# Patient Record
Sex: Female | Born: 1945 | Race: Black or African American | Hispanic: No | Smoking: Current every day smoker
Health system: Southern US, Community
[De-identification: ages and names within clinical notes are randomized; demographics above are authoritative.]

---

## 2006-07-13 ENCOUNTER — Emergency Department (HOSPITAL_COMMUNITY): Admission: EM | Admit: 2006-07-13 | Discharge: 2006-07-13 | Payer: Self-pay | Admitting: Emergency Medicine

## 2012-05-21 DIAGNOSIS — J309 Allergic rhinitis, unspecified: Secondary | ICD-10-CM | POA: Insufficient documentation

## 2012-05-21 DIAGNOSIS — F17210 Nicotine dependence, cigarettes, uncomplicated: Secondary | ICD-10-CM | POA: Insufficient documentation

## 2012-05-21 DIAGNOSIS — I10 Essential (primary) hypertension: Secondary | ICD-10-CM | POA: Insufficient documentation

## 2012-07-02 DIAGNOSIS — L309 Dermatitis, unspecified: Secondary | ICD-10-CM | POA: Insufficient documentation

## 2012-10-14 ENCOUNTER — Other Ambulatory Visit: Payer: Self-pay | Admitting: Family Medicine

## 2012-11-24 ENCOUNTER — Ambulatory Visit
Admission: RE | Admit: 2012-11-24 | Discharge: 2012-11-24 | Disposition: A | Discharge status: Home / Self Care | Payer: Medicare Other | Source: Ambulatory Visit | Attending: Family Medicine | Admitting: Family Medicine

## 2012-11-24 DIAGNOSIS — Z1231 Encounter for screening mammogram for malignant neoplasm of breast: Secondary | ICD-10-CM

## 2012-11-24 DIAGNOSIS — Z780 Asymptomatic menopausal state: Secondary | ICD-10-CM

## 2013-01-24 DIAGNOSIS — D126 Benign neoplasm of colon, unspecified: Secondary | ICD-10-CM | POA: Insufficient documentation

## 2013-11-09 ENCOUNTER — Other Ambulatory Visit: Payer: Self-pay

## 2013-11-09 DIAGNOSIS — M159 Polyosteoarthritis, unspecified: Secondary | ICD-10-CM | POA: Insufficient documentation

## 2013-11-09 DIAGNOSIS — Z1231 Encounter for screening mammogram for malignant neoplasm of breast: Secondary | ICD-10-CM

## 2013-12-08 ENCOUNTER — Ambulatory Visit
Admission: RE | Admit: 2013-12-08 | Discharge: 2013-12-08 | Disposition: A | Discharge status: Home / Self Care | Payer: Medicare Other | Source: Ambulatory Visit

## 2013-12-08 DIAGNOSIS — Z1231 Encounter for screening mammogram for malignant neoplasm of breast: Secondary | ICD-10-CM

## 2014-02-08 DIAGNOSIS — H35039 Hypertensive retinopathy, unspecified eye: Secondary | ICD-10-CM | POA: Insufficient documentation

## 2014-11-01 ENCOUNTER — Other Ambulatory Visit: Payer: Self-pay

## 2014-11-01 DIAGNOSIS — Z1231 Encounter for screening mammogram for malignant neoplasm of breast: Secondary | ICD-10-CM

## 2014-12-12 ENCOUNTER — Ambulatory Visit
Admission: RE | Admit: 2014-12-12 | Discharge: 2014-12-12 | Disposition: A | Discharge status: Home / Self Care | Payer: Medicare Other | Source: Ambulatory Visit

## 2014-12-12 DIAGNOSIS — Z1231 Encounter for screening mammogram for malignant neoplasm of breast: Secondary | ICD-10-CM

## 2015-12-05 ENCOUNTER — Other Ambulatory Visit: Payer: Self-pay

## 2015-12-05 DIAGNOSIS — Z1231 Encounter for screening mammogram for malignant neoplasm of breast: Secondary | ICD-10-CM

## 2015-12-27 DIAGNOSIS — G250 Essential tremor: Secondary | ICD-10-CM | POA: Insufficient documentation

## 2016-01-02 ENCOUNTER — Other Ambulatory Visit: Payer: Self-pay | Admitting: Family Medicine

## 2016-01-02 DIAGNOSIS — E2839 Other primary ovarian failure: Secondary | ICD-10-CM

## 2016-01-18 ENCOUNTER — Ambulatory Visit: Payer: Medicare Other

## 2016-01-23 ENCOUNTER — Ambulatory Visit
Admission: RE | Admit: 2016-01-23 | Discharge: 2016-01-23 | Disposition: A | Discharge status: Home / Self Care | Payer: Medicare HMO | Source: Ambulatory Visit

## 2016-01-23 ENCOUNTER — Ambulatory Visit
Admission: RE | Admit: 2016-01-23 | Discharge: 2016-01-23 | Disposition: A | Discharge status: Home / Self Care | Payer: Medicare HMO | Source: Ambulatory Visit | Attending: Family Medicine | Admitting: Family Medicine

## 2016-01-23 DIAGNOSIS — Z1231 Encounter for screening mammogram for malignant neoplasm of breast: Secondary | ICD-10-CM

## 2016-01-23 DIAGNOSIS — E2839 Other primary ovarian failure: Secondary | ICD-10-CM

## 2016-02-06 DIAGNOSIS — M8580 Other specified disorders of bone density and structure, unspecified site: Secondary | ICD-10-CM | POA: Insufficient documentation

## 2016-02-06 DIAGNOSIS — M810 Age-related osteoporosis without current pathological fracture: Secondary | ICD-10-CM | POA: Insufficient documentation

## 2016-11-06 ENCOUNTER — Other Ambulatory Visit: Payer: Self-pay | Admitting: Acute Care

## 2016-11-06 DIAGNOSIS — F17210 Nicotine dependence, cigarettes, uncomplicated: Principal | ICD-10-CM

## 2016-12-04 ENCOUNTER — Encounter: Payer: Self-pay | Admitting: Acute Care

## 2016-12-04 ENCOUNTER — Ambulatory Visit (INDEPENDENT_AMBULATORY_CARE_PROVIDER_SITE_OTHER)
Admission: RE | Admit: 2016-12-04 | Discharge: 2016-12-04 | Disposition: A | Discharge status: Home / Self Care | Payer: Medicare HMO | Source: Ambulatory Visit | Attending: Acute Care | Admitting: Acute Care

## 2016-12-04 ENCOUNTER — Ambulatory Visit (INDEPENDENT_AMBULATORY_CARE_PROVIDER_SITE_OTHER): Payer: Medicare HMO | Admitting: Acute Care

## 2016-12-04 DIAGNOSIS — F17210 Nicotine dependence, cigarettes, uncomplicated: Secondary | ICD-10-CM

## 2016-12-04 DIAGNOSIS — Z87891 Personal history of nicotine dependence: Secondary | ICD-10-CM | POA: Diagnosis not present

## 2016-12-04 NOTE — Progress Notes (Signed)
Shared Decision Making Visit Lung Cancer Screening Program [phone])   Eligibility:  Age 71 y.o.  Pack Years Smoking History Calculation 52 pack year smoking history (# packs/per year x # years smoked)  Recent History of coughing up blood  no  Unexplained weight loss? no ( >Than 15 pounds within the last 6 months )  Prior History Lung / other cancer no (Diagnosis within the last 5 years already requiring surveillance chest CT Scans).  Smoking Status Current Smoker  Former Smokers: Years since quit: Not applicable  Quit Date: Not applicable  Visit Components:  Discussion included one or more decision making aids. yes  Discussion included risk/benefits of screening. yes  Discussion included potential follow up diagnostic testing for abnormal scans. yes  Discussion included meaning and risk of over diagnosis. yes  Discussion included meaning and risk of False Positives. yes  Discussion included meaning of total radiation exposure. yes  Counseling Included:  Importance of adherence to annual lung cancer LDCT screening. yes  Impact of comorbidities on ability to participate in the program. yes  Ability and willingness to under diagnostic treatment. yes  Smoking Cessation Counseling:  Current Smokers:   Discussed importance of smoking cessation. yes  Information about tobacco cessation classes and interventions provided to patient. yes  Patient provided with "ticket" for LDCT Scan. yes  Symptomatic Patient. no  Counseling  Diagnosis Code: Tobacco Use Z72.0  Asymptomatic Patient yes  Counseling (Intermediate counseling: > three minutes counseling) [id number]  Former Smokers:   Discussed the importance of maintaining cigarette abstinence. yes  Diagnosis Code: Personal History of Nicotine Dependence. W29.562  Information about tobacco cessation classes and interventions provided to patient. Yes  Patient provided with "ticket" for LDCT Scan. yes  Written Order  for Lung Cancer Screening with LDCT placed in Epic. Yes (CT Chest Lung Cancer Screening Low Dose W/O CM) [id number] Z12.2-Screening of respiratory organs Z87.891-Personal history of nicotine dependence  I have spent 25 minutes of face to face time with Abigail Guerrero discussing the risks and benefits of lung cancer screening. We viewed a power point together that explained in detail the above noted topics. We paused at intervals to allow for questions to be asked and answered to ensure understanding.We discussed that the single most powerful action that she can take to decrease her risk of developing lung cancer is to quit smoking. We discussed whether or not she is ready to commit to setting a quit date. She is currently not ready to set a quit date. We discussed options for tools to aid in quitting smoking including nicotine replacement therapy, non-nicotine medications, support groups, Quit Smart classes, and behavior modification. We discussed that often times setting smaller, more achievable goals, such as eliminating 1 cigarette a day for a week and then 2 cigarettes a day for a week can be helpful in slowly decreasing the number of cigarettes smoked. This allows for a sense of accomplishment as well as providing a clinical benefit. I gave her the " Be Stronger Than Your Excuses" card with contact information for community resources, classes, free nicotine replacement therapy, and access to mobile apps, text messaging, and on-line smoking cessation help. I have also given her my card and contact information in the event she needs to contact me. We discussed the time and location of the scan, and that either Abigail Miyamoto RN, or I will call with the results within 24-48 hours of receiving them. I have offered her  a copy of the power point we  viewed  as a resource in the event they need reinforcement of the concepts we discussed today in the office. The patient verbalized understanding of all of  the above  and had no further questions upon leaving the office. They have my contact information in the event they have any further questions.  I spent 3-4 minutes of today's visit counseling on the importance of smoking cessation. We reviewed the health risk factors for continued tobacco abuse.  We discussed that there has been a high incidence of coronary artery disease noted on these scans. I explained to the patient that degree of severity cannot be determined as this is a non-gated exam. I am unsure if this patient is taking statin therapy as there is no medical record with her medication list in Epic. I explained to her that I would send the results of the scan to her primary care provider. I have asked her to follow-up with her PCP as she feels is clinically indicated. Patient verbalized understanding of the above and had no further questions at completion of the appointment.  Bevelyn Ngo, NP 12/04/2016

## 2016-12-16 ENCOUNTER — Other Ambulatory Visit: Payer: Self-pay | Admitting: Acute Care

## 2016-12-16 DIAGNOSIS — F17210 Nicotine dependence, cigarettes, uncomplicated: Principal | ICD-10-CM

## 2017-01-09 DIAGNOSIS — M1A9XX0 Chronic gout, unspecified, without tophus (tophi): Secondary | ICD-10-CM | POA: Insufficient documentation

## 2017-01-22 ENCOUNTER — Other Ambulatory Visit: Payer: Self-pay | Admitting: Family Medicine

## 2017-01-22 DIAGNOSIS — Z1231 Encounter for screening mammogram for malignant neoplasm of breast: Secondary | ICD-10-CM

## 2017-02-12 ENCOUNTER — Ambulatory Visit
Admission: RE | Admit: 2017-02-12 | Discharge: 2017-02-12 | Disposition: A | Discharge status: Home / Self Care | Payer: Medicare HMO | Source: Ambulatory Visit | Attending: Family Medicine | Admitting: Family Medicine

## 2017-02-12 DIAGNOSIS — Z1231 Encounter for screening mammogram for malignant neoplasm of breast: Secondary | ICD-10-CM

## 2017-12-16 ENCOUNTER — Ambulatory Visit (INDEPENDENT_AMBULATORY_CARE_PROVIDER_SITE_OTHER)
Admission: RE | Admit: 2017-12-16 | Discharge: 2017-12-16 | Disposition: A | Discharge status: Home / Self Care | Payer: Medicare HMO | Source: Ambulatory Visit | Attending: Acute Care | Admitting: Acute Care

## 2017-12-16 DIAGNOSIS — F17210 Nicotine dependence, cigarettes, uncomplicated: Secondary | ICD-10-CM | POA: Diagnosis not present

## 2017-12-26 ENCOUNTER — Other Ambulatory Visit: Payer: Self-pay | Admitting: Acute Care

## 2017-12-26 DIAGNOSIS — F17210 Nicotine dependence, cigarettes, uncomplicated: Principal | ICD-10-CM

## 2017-12-26 DIAGNOSIS — Z122 Encounter for screening for malignant neoplasm of respiratory organs: Secondary | ICD-10-CM

## 2018-01-28 ENCOUNTER — Other Ambulatory Visit: Payer: Self-pay | Admitting: Family Medicine

## 2018-01-28 DIAGNOSIS — Z1231 Encounter for screening mammogram for malignant neoplasm of breast: Secondary | ICD-10-CM

## 2018-02-18 ENCOUNTER — Ambulatory Visit
Admission: RE | Admit: 2018-02-18 | Discharge: 2018-02-18 | Disposition: A | Discharge status: Home / Self Care | Payer: Medicare HMO | Source: Ambulatory Visit | Attending: Family Medicine | Admitting: Family Medicine

## 2018-02-18 DIAGNOSIS — Z1231 Encounter for screening mammogram for malignant neoplasm of breast: Secondary | ICD-10-CM

## 2018-05-13 ENCOUNTER — Other Ambulatory Visit: Payer: Self-pay | Admitting: Family Medicine

## 2018-05-13 DIAGNOSIS — E2839 Other primary ovarian failure: Secondary | ICD-10-CM

## 2018-07-07 ENCOUNTER — Ambulatory Visit
Admission: RE | Admit: 2018-07-07 | Discharge: 2018-07-07 | Disposition: A | Discharge status: Home / Self Care | Payer: Medicare HMO | Source: Ambulatory Visit | Attending: Family Medicine | Admitting: Family Medicine

## 2018-07-07 DIAGNOSIS — E2839 Other primary ovarian failure: Secondary | ICD-10-CM

## 2019-02-09 ENCOUNTER — Other Ambulatory Visit: Payer: Self-pay | Admitting: *Deleted

## 2019-02-09 DIAGNOSIS — Z122 Encounter for screening for malignant neoplasm of respiratory organs: Secondary | ICD-10-CM

## 2019-02-09 DIAGNOSIS — Z87891 Personal history of nicotine dependence: Secondary | ICD-10-CM

## 2019-02-09 DIAGNOSIS — F17210 Nicotine dependence, cigarettes, uncomplicated: Secondary | ICD-10-CM

## 2019-03-12 ENCOUNTER — Ambulatory Visit (INDEPENDENT_AMBULATORY_CARE_PROVIDER_SITE_OTHER)
Admission: RE | Admit: 2019-03-12 | Discharge: 2019-03-12 | Disposition: A | Discharge status: Home / Self Care | Payer: Medicare HMO | Source: Ambulatory Visit | Attending: Acute Care | Admitting: Acute Care

## 2019-03-12 ENCOUNTER — Other Ambulatory Visit: Payer: Self-pay

## 2019-03-12 DIAGNOSIS — Z122 Encounter for screening for malignant neoplasm of respiratory organs: Secondary | ICD-10-CM

## 2019-03-12 DIAGNOSIS — Z87891 Personal history of nicotine dependence: Secondary | ICD-10-CM

## 2019-03-12 DIAGNOSIS — F17210 Nicotine dependence, cigarettes, uncomplicated: Secondary | ICD-10-CM

## 2019-03-22 ENCOUNTER — Other Ambulatory Visit: Payer: Self-pay | Admitting: *Deleted

## 2019-03-22 DIAGNOSIS — Z87891 Personal history of nicotine dependence: Secondary | ICD-10-CM

## 2019-03-22 DIAGNOSIS — F17210 Nicotine dependence, cigarettes, uncomplicated: Secondary | ICD-10-CM

## 2019-03-22 DIAGNOSIS — Z122 Encounter for screening for malignant neoplasm of respiratory organs: Secondary | ICD-10-CM

## 2019-03-25 ENCOUNTER — Other Ambulatory Visit: Payer: Self-pay | Admitting: Family Medicine

## 2019-03-25 DIAGNOSIS — Z1231 Encounter for screening mammogram for malignant neoplasm of breast: Secondary | ICD-10-CM

## 2019-05-12 ENCOUNTER — Ambulatory Visit
Admission: RE | Admit: 2019-05-12 | Discharge: 2019-05-12 | Disposition: A | Discharge status: Home / Self Care | Payer: Medicare HMO | Source: Ambulatory Visit | Attending: Family Medicine | Admitting: Family Medicine

## 2019-05-12 ENCOUNTER — Other Ambulatory Visit: Payer: Self-pay

## 2019-05-12 DIAGNOSIS — Z1231 Encounter for screening mammogram for malignant neoplasm of breast: Secondary | ICD-10-CM

## 2020-04-03 ENCOUNTER — Other Ambulatory Visit: Payer: Self-pay | Admitting: Family Medicine

## 2020-04-03 ENCOUNTER — Ambulatory Visit (INDEPENDENT_AMBULATORY_CARE_PROVIDER_SITE_OTHER)
Admission: RE | Admit: 2020-04-03 | Discharge: 2020-04-03 | Disposition: A | Discharge status: Home / Self Care | Payer: Medicare HMO | Source: Ambulatory Visit | Attending: Acute Care | Admitting: Acute Care

## 2020-04-03 ENCOUNTER — Other Ambulatory Visit: Payer: Self-pay

## 2020-04-03 DIAGNOSIS — Z122 Encounter for screening for malignant neoplasm of respiratory organs: Secondary | ICD-10-CM

## 2020-04-03 DIAGNOSIS — F17210 Nicotine dependence, cigarettes, uncomplicated: Secondary | ICD-10-CM

## 2020-04-03 DIAGNOSIS — Z87891 Personal history of nicotine dependence: Secondary | ICD-10-CM

## 2020-04-03 DIAGNOSIS — Z1231 Encounter for screening mammogram for malignant neoplasm of breast: Secondary | ICD-10-CM

## 2020-04-06 ENCOUNTER — Other Ambulatory Visit: Payer: Self-pay | Admitting: *Deleted

## 2020-04-06 DIAGNOSIS — F17210 Nicotine dependence, cigarettes, uncomplicated: Secondary | ICD-10-CM

## 2020-04-06 DIAGNOSIS — Z87891 Personal history of nicotine dependence: Secondary | ICD-10-CM

## 2020-04-06 NOTE — Progress Notes (Signed)
Please call patient and let them  know their  low dose Ct was read as a Lung RADS 2: nodules that are benign in appearance and behavior with a very low likelihood of becoming a clinically active cancer due to size or lack of growth. Recommendation per radiology is for a repeat LDCT in 12 months. .Please let them  know we will order and schedule their  annual screening scan for 03/2021 Please let them  know there was notation of CAD on their  scan.  Please remind the patient  that this is a non-gated exam therefore degree or severity of disease  cannot be determined. Please have them  follow up with their PCP regarding potential risk factor modification, dietary therapy or pharmacologic therapy if clinically indicated. Pt.  is not  currently on statin therapy. Please place order for annual  screening scan for  03/2021 and fax results to PCP. Thanks so much.  

## 2020-05-12 ENCOUNTER — Ambulatory Visit
Admission: RE | Admit: 2020-05-12 | Discharge: 2020-05-12 | Disposition: A | Discharge status: Home / Self Care | Payer: Medicare HMO | Source: Ambulatory Visit | Attending: Family Medicine | Admitting: Family Medicine

## 2020-05-12 ENCOUNTER — Other Ambulatory Visit: Payer: Self-pay

## 2020-05-12 DIAGNOSIS — Z1231 Encounter for screening mammogram for malignant neoplasm of breast: Secondary | ICD-10-CM

## 2020-06-07 DIAGNOSIS — R87610 Atypical squamous cells of undetermined significance on cytologic smear of cervix (ASC-US): Secondary | ICD-10-CM | POA: Insufficient documentation

## 2020-06-07 DIAGNOSIS — E785 Hyperlipidemia, unspecified: Secondary | ICD-10-CM | POA: Insufficient documentation

## 2020-06-07 DIAGNOSIS — I10 Essential (primary) hypertension: Secondary | ICD-10-CM | POA: Insufficient documentation

## 2020-10-24 ENCOUNTER — Other Ambulatory Visit: Payer: Self-pay | Admitting: Family Medicine

## 2020-10-24 DIAGNOSIS — M8580 Other specified disorders of bone density and structure, unspecified site: Secondary | ICD-10-CM

## 2021-04-05 ENCOUNTER — Other Ambulatory Visit: Payer: Self-pay

## 2021-04-05 ENCOUNTER — Ambulatory Visit (INDEPENDENT_AMBULATORY_CARE_PROVIDER_SITE_OTHER)
Admission: RE | Admit: 2021-04-05 | Discharge: 2021-04-05 | Disposition: A | Discharge status: Home / Self Care | Payer: Medicare HMO | Source: Ambulatory Visit | Attending: Family Medicine | Admitting: Family Medicine

## 2021-04-05 DIAGNOSIS — F17210 Nicotine dependence, cigarettes, uncomplicated: Secondary | ICD-10-CM

## 2021-04-05 DIAGNOSIS — Z87891 Personal history of nicotine dependence: Secondary | ICD-10-CM

## 2021-04-13 ENCOUNTER — Other Ambulatory Visit: Payer: Medicare HMO

## 2021-04-19 NOTE — Progress Notes (Signed)
Please call patient and let them  know their  low dose Ct was read as a Lung RADS 2: nodules that are benign in appearance and behavior with a very low likelihood of becoming a clinically active cancer due to size or lack of growth. Recommendation per radiology is for a repeat LDCT in 12 months. .Please let them  know we will order and schedule their  annual screening scan for 03/2022. Please let them  know there was notation of CAD on their  scan.  Please remind the patient  that this is a non-gated exam therefore degree or severity of disease  cannot be determined. Please have them  follow up with their PCP regarding potential risk factor modification, dietary therapy or pharmacologic therapy if clinically indicated. Pt.  is  currently on statin therapy. Please place order for annual  screening scan for   03/2022 and fax results to PCP. Thanks so much.  + Aortic atherosclerosis, on statin, seen by cards in 2021, make sure she knows to follow up with them. Thanks

## 2021-05-03 ENCOUNTER — Encounter: Payer: Self-pay | Admitting: *Deleted

## 2021-05-03 DIAGNOSIS — Z87891 Personal history of nicotine dependence: Secondary | ICD-10-CM

## 2021-05-03 DIAGNOSIS — F17210 Nicotine dependence, cigarettes, uncomplicated: Secondary | ICD-10-CM

## 2021-06-11 ENCOUNTER — Other Ambulatory Visit: Payer: Self-pay | Admitting: Family Medicine

## 2021-06-11 DIAGNOSIS — Z1231 Encounter for screening mammogram for malignant neoplasm of breast: Secondary | ICD-10-CM

## 2021-06-13 ENCOUNTER — Ambulatory Visit
Admission: RE | Admit: 2021-06-13 | Discharge: 2021-06-13 | Disposition: A | Discharge status: Home / Self Care | Payer: Medicare HMO | Source: Ambulatory Visit | Attending: Family Medicine | Admitting: Family Medicine

## 2021-06-13 DIAGNOSIS — M8580 Other specified disorders of bone density and structure, unspecified site: Secondary | ICD-10-CM

## 2021-06-20 ENCOUNTER — Other Ambulatory Visit: Payer: Self-pay | Admitting: Family Medicine

## 2021-06-20 DIAGNOSIS — M8580 Other specified disorders of bone density and structure, unspecified site: Secondary | ICD-10-CM

## 2021-07-16 ENCOUNTER — Ambulatory Visit
Admission: RE | Admit: 2021-07-16 | Discharge: 2021-07-16 | Disposition: A | Discharge status: Home / Self Care | Payer: Medicare HMO | Source: Ambulatory Visit | Attending: Family Medicine | Admitting: Family Medicine

## 2021-07-16 ENCOUNTER — Ambulatory Visit: Payer: Medicare HMO

## 2021-07-16 ENCOUNTER — Other Ambulatory Visit: Payer: Self-pay

## 2021-07-16 DIAGNOSIS — Z1231 Encounter for screening mammogram for malignant neoplasm of breast: Secondary | ICD-10-CM

## 2021-12-26 ENCOUNTER — Ambulatory Visit: Payer: Medicare HMO

## 2022-01-30 ENCOUNTER — Ambulatory Visit: Payer: Medicare HMO

## 2022-02-26 NOTE — Therapy (Unsigned)
OUTPATIENT PHYSICAL THERAPY LOWER EXTREMITY EVALUATION   Patient Name: Abigail Guerrero MRN: [id number] DOB:11/09/1945, 76 y.o., female Today's Date: 02/27/2022   PT End of Session - 02/27/22 1157     Visit Number 1    Number of Visits 4    Date for PT Re-Evaluation 04/10/22    Authorization Type [employer] MCR    PT Start Time 1115    PT Stop Time 1200    PT Time Calculation (min) 45 min    Activity Tolerance Patient tolerated treatment well    Behavior During Therapy [facility] for tasks assessed/performed             History reviewed. No pertinent past medical history. History reviewed. No pertinent surgical history. There are no problems to display for this patient.   PCP: Tracey Harries, MD  REFERRING PROVIDER: Tracey Harries, MD  REFERRING DIAG: B knee pain  THERAPY DIAG: B knee pain   Rationale for Evaluation and Treatment Rehabilitation  ONSET DATE: 12/10/2021   SUBJECTIVE:   SUBJECTIVE STATEMENT: Describes pain and swelling in B knees L>R, has difficulty arising from sitting, has had knee injections with temporary relief of symptoms.  Has been recommended to undergo B knee replacements but is reluctant.   PERTINENT HISTORY: Patient ID: Abigail Guerrero is a 76 y.o. (DOB 05/31/1946) female.  Assessment and Plan  Abigail Guerrero was seen today for medicare wellness visit.  Diagnoses and all orders for this visit:  At moderate risk for fall - AMB REFERRAL TO PHYSICAL THERAPY EVALUATION AND TREATMENT  Chronic pain of both knees - AMB REFERRAL TO PHYSICAL THERAPY EVALUATION AND TREATMENT   PAIN:  Are you having pain? Yes: NPRS scale: 7/10 Pain location: B knees Pain description: ache Aggravating factors: stairs  Relieving factors: rest  PRECAUTIONS: Fall  WEIGHT BEARING RESTRICTIONS No  FALLS:  Has patient fallen in last 6 months? No  LIVING ENVIRONMENT: Lives with: lives with their family Lives in: House/apartment Stairs:   avoids Has following equipment at home: Single point cane  OCCUPATION: retired  PLOF: Independent with basic ADLs  PATIENT GOALS To reduce my knee pain   OBJECTIVE:   DIAGNOSTIC FINDINGS: none noted  PATIENT SURVEYS:  FOTO 49(61 predicted)  COGNITION:  Overall cognitive status: Within functional limits for tasks assessed     SENSATION: Not tested   MUSCLE LENGTH: Hamstrings: Right 70 deg; Left 70 deg   POSTURE:  B knee valgus    LOWER EXTREMITY ROM:  Active ROM Right eval Left eval  Hip flexion    Hip extension    Hip abduction    Hip adduction    Hip internal rotation    Hip external rotation    Knee flexion 110d 100d  Knee extension 0d 0d  Ankle dorsiflexion    Ankle plantarflexion    Ankle inversion    Ankle eversion     (Blank rows = not tested)  LOWER EXTREMITY MMT:  MMT Right eval Left eval  Hip flexion 4 4  Hip extension    Hip abduction 4- 4-  Hip adduction    Hip internal rotation    Hip external rotation    Knee flexion 4- 4-  Knee extension 4- 4-  Ankle dorsiflexion 4 4  Ankle plantarflexion 4 4  Ankle inversion    Ankle eversion     (Blank rows = not tested)  LOWER EXTREMITY SPECIAL TESTS:  Not applicable   FUNCTIONAL TESTS:  30 seconds chair stand test 3 stand  with UE Assist  GAIT: Distance walked: 18ft x2 Assistive device utilized: None Level of assistance: Modified independence Comments: antalgic gait excess lateral weight shift    TODAY'S TREATMENT: Eval and HEP   PATIENT EDUCATION:  Education details: Discussed eval findings, rehab rationale and POC and patient is in agreement  Person educated: Patient Education method: [employer] Education comprehension: verbalized understanding and needs further education   HOME EXERCISE PROGRAM: Access Code: [email or web address] URL: [email or web address] Date: 02/27/2022 Prepared by: Gustavus Bryant  Exercises - Small Range Straight Leg Raise  - 1  x daily - 7 x weekly - 2 sets - 10 reps - Supine Bridge  - 1 x daily - 7 x weekly - 2 sets - 10 reps - Hooklying Single Leg Bent Knee Fallouts with Resistance  - 1 x daily - 7 x weekly - 2 sets - 10 reps - Heel Raises with Counter Support  - 1 x daily - 7 x weekly - 2 sets - 10 reps - Heel Toe Raises with Counter Support  - 1 x daily - 7 x weekly - 2 sets - 10 reps - Standing Knee Flexion with Counter Support  - 1 x daily - 7 x weekly - 2 sets - 10 reps  ASSESSMENT:  CLINICAL IMPRESSION: Patient is a 76 y.o. female who was seen today for physical therapy evaluation and treatment for B knee pain in presence of B OA.   She has been recommended for B knee replacement but is pursuing conservative options.  Previous knee injections have provided temporary relief.   Patient demos strength deficits in quads primarily and knee flexion is limited by pain and soft tissue restrictions.   OBJECTIVE IMPAIRMENTS Abnormal gait, decreased activity tolerance, decreased balance, decreased coordination, decreased endurance, decreased mobility, difficulty walking, decreased strength, postural dysfunction, and pain.   ACTIVITY LIMITATIONS squatting and stairs    PERSONAL FACTORS Age, Fitness, Past/current experiences, and Time since onset of injury/illness/exacerbation are also affecting patient's functional outcome.   REHAB POTENTIAL: Good  CLINICAL DECISION MAKING: Stable/uncomplicated  EVALUATION COMPLEXITY: Low   GOALS: Goals reviewed with patient? Yes  SHORT TERM GOALS: STGs=LTGs   LONG TERM GOALS: Target date: 03/27/2022   Decrease knee pain to 5/10 Baseline: 7/10 Goal status: INITIAL  2.  4/5 B knee extension strength Baseline: 4-/5 B knee extension strength Goal status: INITIAL  3.  Improve 30s chair stand to 4 reps w/UE assist Baseline: 3 stands with UE assist Goal status: INITIAL  4.  Increase FOTO score to 61 Baseline: 49 Goal status: INITIAL   PLAN: PT FREQUENCY:  1x/week  PT DURATION: 4 weeks  PLANNED INTERVENTIONS: Therapeutic exercises, Therapeutic activity, Neuromuscular re-education, Balance training, Gait training, Patient/Family education, Joint mobilization, Stair training, Aquatic Therapy, Manual therapy, and Re-evaluation  PLAN FOR NEXT SESSION: HEP f/u and update, knee strength as tolerated, balance training, aerobic    Hildred Laser, PT 02/27/2022, 11:58 AM   Referring diagnosis? B knee pain Treatment diagnosis? (if different than referring diagnosis) B knee pain due to advanced OA What was this (referring dx) caused by? []  Surgery []  Fall []  Ongoing issue [x]  Arthritis []  Other: ____________  Laterality: []  Rt []  Lt [x]  Both  Check all possible CPT codes:  *CHOOSE 10 OR LESS*    [x]  97110 (Therapeutic Exercise)  []  (SLP Treatment)  [x]  97112 (Neuro Re-ed)   []  92526 (Swallowing Treatment)   [x]  97116 (Gait Training)   []  (Cognitive Training, 1st 15 minutes) [  x] 97140 (Manual Therapy)   []  97130 (Cognitive Training, each add'l 15 minutes)  [x]  (Re-evaluation)                              []  Other, List CPT Code ____________  [x]  97530 (Therapeutic Activities)     [x]  97535 (Self Care)   []  All codes above (97110 - 97535)  []  97012 (Mechanical Traction)  []  97014 (E-stim Unattended)  []  97032 (E-stim manual)  []  97033 (Ionto)  []  97035 (Ultrasound) []  97750 (Physical Performance Training) [x]  (Aquatic Therapy) []  97016 (Vasopneumatic Device) []  67619 (Paraffin) []  97034 (Contrast Bath) []  97597 (Wound Care 1st 20 sq cm) []  97598 (Wound Care each add'l 20 sq cm) []  97760 (Orthotic Fabrication, Fitting, Training Initial) []  (Prosthetic Management and Training Initial) []  (Orthotic or Prosthetic Training/ Modification Subsequent)

## 2022-02-27 ENCOUNTER — Ambulatory Visit: Payer: Medicare HMO | Attending: Family Medicine

## 2022-02-27 DIAGNOSIS — M25562 Pain in left knee: Secondary | ICD-10-CM | POA: Insufficient documentation

## 2022-02-27 DIAGNOSIS — R2681 Unsteadiness on feet: Secondary | ICD-10-CM | POA: Insufficient documentation

## 2022-02-27 DIAGNOSIS — M25561 Pain in right knee: Secondary | ICD-10-CM | POA: Insufficient documentation

## 2022-02-27 DIAGNOSIS — G8929 Other chronic pain: Secondary | ICD-10-CM | POA: Diagnosis present

## 2022-02-27 DIAGNOSIS — M6281 Muscle weakness (generalized): Secondary | ICD-10-CM | POA: Insufficient documentation

## 2022-03-05 NOTE — Therapy (Signed)
OUTPATIENT PHYSICAL THERAPY TREATMENT NOTE   Patient Name: Abigail Guerrero MRN: [id number] DOB:09/27/1945, 76 y.o., female Today's Date: 03/08/2022  PCP: Tracey Harries, MD REFERRING PROVIDER: Tracey Harries, MD  END OF SESSION:   PT End of Session - 03/08/22 1000     Visit Number 2    Date for PT Re-Evaluation 04/10/22    Authorization Type [employer] MCR    PT Start Time 1000    PT Stop Time 1040    PT Time Calculation (min) 40 min    Activity Tolerance Patient tolerated treatment well    Behavior During Therapy [facility] for tasks assessed/performed             No past medical history on file. No past surgical history on file. There are no problems to display for this patient.   REFERRING DIAG: B knee pain  THERAPY DIAG: B knee pain   Rationale for Evaluation and Treatment Rehabilitation  PERTINENT HISTORY: Patient ID: Abigail Guerrero is a 76 y.o. (DOB [birth year]) female.  Assessment and Plan  Alphonsine was seen today for medicare wellness visit.  Diagnoses and all orders for this visit:  At moderate risk for fall - AMB REFERRAL TO PHYSICAL THERAPY EVALUATION AND TREATMENT  Chronic pain of both knees - AMB REFERRAL TO PHYSICAL THERAPY EVALUATION AND TREATMENT   PRECAUTIONS: fall  SUBJECTIVE: No changes to report, has been compliant with HEP  PAIN:  Are you having pain? Yes 7/10 B knees, achy worse with activity and better with rest   OBJECTIVE: (objective measures completed at initial evaluation unless otherwise dated)  DIAGNOSTIC FINDINGS: none noted   PATIENT SURVEYS:  FOTO 49(61 predicted)   COGNITION:           Overall cognitive status: Within functional limits for tasks assessed                          SENSATION: Not tested     MUSCLE LENGTH: Hamstrings: Right 70 deg; Left 70 deg     POSTURE:  B knee valgus      LOWER EXTREMITY ROM:   Active ROM Right eval Left eval  Hip flexion      Hip extension       Hip abduction      Hip adduction      Hip internal rotation      Hip external rotation      Knee flexion 110d 100d  Knee extension 0d 0d  Ankle dorsiflexion      Ankle plantarflexion      Ankle inversion      Ankle eversion       (Blank rows = not tested)   LOWER EXTREMITY MMT:   MMT Right eval Left eval  Hip flexion 4 4  Hip extension      Hip abduction 4- 4-  Hip adduction      Hip internal rotation      Hip external rotation      Knee flexion 4- 4-  Knee extension 4- 4-  Ankle dorsiflexion 4 4  Ankle plantarflexion 4 4  Ankle inversion      Ankle eversion       (Blank rows = not tested)   LOWER EXTREMITY SPECIAL TESTS:  Not applicable    FUNCTIONAL TESTS:  30 seconds chair stand test 3 stand with UE Assist   GAIT: Distance walked: 40ft x2 Assistive device utilized: None Level of assistance: Modified independence Comments:  antalgic gait excess lateral weight shift       TODAY'S TREATMENT: [facility] Adult PT Treatment:                                                DATE: 03/08/22 Therapeutic Exercise: Nustep L1 8 min FAQs w/ball 15x Seated hamstring curls YTB 15/15 Bridge 15x Heel slides over slide board 15x B Supine march 15/15 Clams 15/15 Heel raises 15x Toe raises 15x Seated hamstring stretch B 30s x2       PATIENT EDUCATION:  Education details: Discussed eval findings, rehab rationale and POC and patient is in agreement  Person educated: Patient Education method: Explanation and Handouts Education comprehension: verbalized understanding and needs further education     HOME EXERCISE PROGRAM: Access Code: [email or web address] URL: [email or web address] Date: 02/27/2022 Prepared by: Gustavus Bryant   Exercises - Small Range Straight Leg Raise  - 1 x daily - 7 x weekly - 2 sets - 10 reps - Supine Bridge  - 1 x daily - 7 x weekly - 2 sets - 10 reps - Hooklying Single Leg Bent Knee Fallouts with Resistance  - 1 x daily - 7 x weekly - 2 sets -  10 reps - Heel Raises with Counter Support  - 1 x daily - 7 x weekly - 2 sets - 10 reps - Heel Toe Raises with Counter Support  - 1 x daily - 7 x weekly - 2 sets - 10 reps - Standing Knee Flexion with Counter Support  - 1 x daily - 7 x weekly - 2 sets - 10 reps   ASSESSMENT:   CLINICAL IMPRESSION: Initiated ROM, strengthening, stretching and functional training to increase B knee ROM, strength and function as well as diminish pain.  Able to perform all requested tasks w/o aggravation of symptoms     OBJECTIVE IMPAIRMENTS Abnormal gait, decreased activity tolerance, decreased balance, decreased coordination, decreased endurance, decreased mobility, difficulty walking, decreased strength, postural dysfunction, and pain.    ACTIVITY LIMITATIONS squatting and stairs       PERSONAL FACTORS Age, Fitness, Past/current experiences, and Time since onset of injury/illness/exacerbation are also affecting patient's functional outcome.    REHAB POTENTIAL: Good   CLINICAL DECISION MAKING: Stable/uncomplicated   EVALUATION COMPLEXITY: Low     GOALS: Goals reviewed with patient? Yes   SHORT TERM GOALS: STGs=LTGs     LONG TERM GOALS: Target date: 03/27/2022    Decrease knee pain to 5/10 Baseline: 7/10 Goal status: INITIAL   2.  4/5 B knee extension strength Baseline: 4-/5 B knee extension strength Goal status: INITIAL   3.  Improve 30s chair stand to 4 reps w/UE assist Baseline: 3 stands with UE assist Goal status: INITIAL   4.  Increase FOTO score to 61 Baseline: 49 Goal status: INITIAL     PLAN: PT FREQUENCY: 1x/week   PT DURATION: 4 weeks   PLANNED INTERVENTIONS: Therapeutic exercises, Therapeutic activity, Neuromuscular re-education, Balance training, Gait training, Patient/Family education, Joint mobilization, Stair training, Aquatic Therapy, Manual therapy, and Re-evaluation   PLAN FOR NEXT SESSION: HEP f/u and update, knee strength as tolerated, balance training, aerobic  work, CKC    [employer], PT 03/08/2022, 10:42 AM

## 2022-03-08 ENCOUNTER — Ambulatory Visit: Payer: Medicare HMO

## 2022-03-08 DIAGNOSIS — G8929 Other chronic pain: Secondary | ICD-10-CM

## 2022-03-08 DIAGNOSIS — R2681 Unsteadiness on feet: Secondary | ICD-10-CM

## 2022-03-08 DIAGNOSIS — M6281 Muscle weakness (generalized): Secondary | ICD-10-CM

## 2022-03-15 ENCOUNTER — Ambulatory Visit: Payer: Medicare HMO

## 2022-03-15 DIAGNOSIS — M6281 Muscle weakness (generalized): Secondary | ICD-10-CM

## 2022-03-15 DIAGNOSIS — R2681 Unsteadiness on feet: Secondary | ICD-10-CM

## 2022-03-15 DIAGNOSIS — G8929 Other chronic pain: Secondary | ICD-10-CM

## 2022-03-15 DIAGNOSIS — M25562 Pain in left knee: Secondary | ICD-10-CM

## 2022-03-15 NOTE — Therapy (Signed)
OUTPATIENT PHYSICAL THERAPY TREATMENT NOTE   Patient Name: Abigail Guerrero MRN: [id number] DOB:1946/07/01, 76 y.o., female Today's Date: 03/15/2022  PCP: Abigail Harries, MD REFERRING PROVIDER: Tracey Harries, MD  END OF SESSION:   PT End of Session - 03/15/22 0953     Visit Number 3    Number of Visits 4    Date for PT Re-Evaluation 04/10/22    Authorization Type [employer] MCR    PT Start Time 1000    PT Stop Time 1040    PT Time Calculation (min) 40 min    Activity Tolerance Patient tolerated treatment well    Behavior During Therapy [facility] for tasks assessed/performed              History reviewed. No pertinent past medical history. History reviewed. No pertinent surgical history. There are no problems to display for this patient.   REFERRING DIAG: B knee pain  THERAPY DIAG: B knee pain   Rationale for Evaluation and Treatment Rehabilitation  PERTINENT HISTORY: Patient ID: Abigail Guerrero is a 76 y.o. (DOB [birth year]) female.  Assessment and Plan  Abigail Guerrero was seen today for medicare wellness visit.  Diagnoses and all orders for this visit:  At moderate risk for fall - AMB REFERRAL TO PHYSICAL THERAPY EVALUATION AND TREATMENT  Chronic pain of both knees - AMB REFERRAL TO PHYSICAL THERAPY EVALUATION AND TREATMENT   PRECAUTIONS: fall  SUBJECTIVE: Knees feeling a bit better, still notes stiffness after prolonged positioning.  Remains compliant with HEP  PAIN:  Are you having pain? Yes 7/10 B knees, achy worse with activity and better with rest   OBJECTIVE: (objective measures completed at initial evaluation unless otherwise dated)  DIAGNOSTIC FINDINGS: none noted   PATIENT SURVEYS:  FOTO 49(61 predicted)   COGNITION:           Overall cognitive status: Within functional limits for tasks assessed                          SENSATION: Not tested     MUSCLE LENGTH: Hamstrings: Right 70 deg; Left 70 deg     POSTURE:  B  knee valgus      LOWER EXTREMITY ROM:   Active ROM Right eval Left eval  Hip flexion      Hip extension      Hip abduction      Hip adduction      Hip internal rotation      Hip external rotation      Knee flexion 110d 100d  Knee extension 0d 0d  Ankle dorsiflexion      Ankle plantarflexion      Ankle inversion      Ankle eversion       (Blank rows = not tested)   LOWER EXTREMITY MMT:   MMT Right eval Left eval  Hip flexion 4 4  Hip extension      Hip abduction 4- 4-  Hip adduction      Hip internal rotation      Hip external rotation      Knee flexion 4- 4-  Knee extension 4- 4-  Ankle dorsiflexion 4 4  Ankle plantarflexion 4 4  Ankle inversion      Ankle eversion       (Blank rows = not tested)   LOWER EXTREMITY SPECIAL TESTS:  Not applicable    FUNCTIONAL TESTS:  30 seconds chair stand test 3 stand with UE Assist  GAIT: Distance walked: 47ft x2 Assistive device utilized: None Level of assistance: Modified independence Comments: antalgic gait excess lateral weight shift       TODAY'S TREATMENT: [facility] Adult PT Treatment:                                                DATE: 03/15/22 Therapeutic Exercise: Nustep L2 8 min FAQs w/ball 20x 2s hold Seated hamstring curls YTB 20/20 Bridge 2x10 SLR 15/15 Heel slides over slide board 15x B Supine march 15/15 Clams 15/15 SL abduction 15x B Standing marches against wall 10/10 Heel raises 15x against wall Toe raises 15x against wall Seated hamstring stretch B 30s x2  STS w/OH reach  Abigail Guerrero Adult PT Treatment:                                                DATE: 03/08/22 Therapeutic Exercise: Nustep L1 8 min FAQs w/ball 15x Seated hamstring curls YTB 15/15 Bridge 15x Heel slides over slide board 15x B Supine march 15/15 Clams 15/15 Heel raises 15x Toe raises 15x Seated hamstring stretch B 30s x2       PATIENT EDUCATION:  Education details: Discussed eval findings, rehab rationale and POC and  patient is in agreement  Person educated: Patient Education method: Explanation and Handouts Education comprehension: verbalized understanding and needs further education     HOME EXERCISE PROGRAM: Access Code: [email or web address] URL: [email or web address] Date: 02/27/2022 Prepared by: Abigail Guerrero   Exercises - Small Range Straight Leg Raise  - 1 x daily - 7 x weekly - 2 sets - 10 reps - Supine Bridge  - 1 x daily - 7 x weekly - 2 sets - 10 reps - Hooklying Single Leg Bent Knee Fallouts with Resistance  - 1 x daily - 7 x weekly - 2 sets - 10 reps - Heel Raises with Counter Support  - 1 x daily - 7 x weekly - 2 sets - 10 reps - Heel Toe Raises with Counter Support  - 1 x daily - 7 x weekly - 2 sets - 10 reps - Standing Knee Flexion with Counter Support  - 1 x daily - 7 x weekly - 2 sets - 10 reps   ASSESSMENT:   CLINICAL IMPRESSION: Added resistance, reps and introduced new strengthening tasks.  Began CKC tasks and added to HEP.  Discussed DC plana and will assess readiness for DC at next session.   OBJECTIVE IMPAIRMENTS Abnormal gait, decreased activity tolerance, decreased balance, decreased coordination, decreased endurance, decreased mobility, difficulty walking, decreased strength, postural dysfunction, and pain.    ACTIVITY LIMITATIONS squatting and stairs   PERSONAL FACTORS Age, Fitness, Past/current experiences, and Time since onset of injury/illness/exacerbation are also affecting patient's functional outcome.    REHAB POTENTIAL: Good   CLINICAL DECISION MAKING: Stable/uncomplicated   EVALUATION COMPLEXITY: Low     GOALS: Goals reviewed with patient? Yes   SHORT TERM GOALS: STGs=LTGs     LONG TERM GOALS: Target date: 03/27/2022    Decrease knee pain to 5/10 Baseline: 7/10 Goal status: INITIAL   2.  4/5 B knee extension strength Baseline: 4-/5 B knee extension strength Goal status: INITIAL   3.  Improve 30s chair stand to 4  reps w/UE assist Baseline:  3 stands with UE assist Goal status: INITIAL   4.  Increase FOTO score to 61 Baseline: 49 Goal status: INITIAL     PLAN: PT FREQUENCY: 1x/week   PT DURATION: 4 weeks   PLANNED INTERVENTIONS: Therapeutic exercises, Therapeutic activity, Neuromuscular re-education, Balance training, Gait training, Patient/Family education, Joint mobilization, Stair training, Aquatic Therapy, Manual therapy, and Re-evaluation   PLAN FOR NEXT SESSION: HEP f/u and update, knee strength as tolerated, balance training, aerobic work, CKC    Hildred Laser, PT 03/15/2022, 11:49 AM

## 2022-03-20 ENCOUNTER — Ambulatory Visit: Payer: Medicare HMO | Attending: Family Medicine

## 2022-03-20 DIAGNOSIS — G8929 Other chronic pain: Secondary | ICD-10-CM | POA: Diagnosis present

## 2022-03-20 DIAGNOSIS — M25562 Pain in left knee: Secondary | ICD-10-CM | POA: Diagnosis present

## 2022-03-20 DIAGNOSIS — M25561 Pain in right knee: Secondary | ICD-10-CM | POA: Insufficient documentation

## 2022-03-20 DIAGNOSIS — M6281 Muscle weakness (generalized): Secondary | ICD-10-CM | POA: Insufficient documentation

## 2022-03-20 DIAGNOSIS — R2681 Unsteadiness on feet: Secondary | ICD-10-CM | POA: Diagnosis present

## 2022-03-20 NOTE — Therapy (Signed)
OUTPATIENT PHYSICAL THERAPY TREATMENT NOTE   Patient Name: Abigail Guerrero MRN: [id number] DOB:08-07-1946, 77 y.o., female Today's Date: 03/20/2022  PCP: Bernerd Limbo, MD REFERRING PROVIDER: Bernerd Limbo, MD PHYSICAL THERAPY DISCHARGE SUMMARY  Visits from Start of Care: 4  Current functional level related to goals / functional outcomes: Goals met   Remaining deficits: Weakness and pain   Education / Equipment: HEP   Patient agrees to discharge. Patient goals were met. Patient is being discharged due to being pleased with the current functional level.  END OF SESSION:   PT End of Session - 03/20/22 1050     Visit Number 4    Number of Visits 4    Date for PT Re-Evaluation 04/10/22    Authorization Type [employer] MCR    PT Start Time 1045    PT Stop Time 1125    PT Time Calculation (min) 40 min    Activity Tolerance Patient tolerated treatment well    Behavior During Therapy WFL for tasks assessed/performed              No past medical history on file. No past surgical history on file. There are no problems to display for this patient.   REFERRING DIAG: B knee pain  THERAPY DIAG: B knee pain   Rationale for Evaluation and Treatment Rehabilitation  PERTINENT HISTORY: Patient ID: Abigail Guerrero is a 76 y.o. (DOB May 30, 1946) female.  Assessment and Plan  [name] was seen today for medicare wellness visit.  Diagnoses and all orders for this visit:  At moderate risk for fall - AMB REFERRAL TO PHYSICAL THERAPY EVALUATION AND TREATMENT  Chronic pain of both knees - AMB REFERRAL TO PHYSICAL THERAPY EVALUATION AND TREATMENT   PRECAUTIONS: fall  SUBJECTIVE: Knees feeling a bit better, still notes stiffness after prolonged positioning.  Remains compliant with HEP  PAIN:  Are you having pain? Yes 7/10 B knees, achy worse with activity and better with rest   OBJECTIVE: (objective measures completed at initial evaluation unless  otherwise dated)  DIAGNOSTIC FINDINGS: none noted   PATIENT SURVEYS:  FOTO 49(61 predicted)   COGNITION:           Overall cognitive status: Within functional limits for tasks assessed                          SENSATION: Not tested     MUSCLE LENGTH: Hamstrings: Right 70 deg; Left 70 deg     POSTURE:  B knee valgus      LOWER EXTREMITY ROM:   Active ROM Right eval Left eval  Hip flexion      Hip extension      Hip abduction      Hip adduction      Hip internal rotation      Hip external rotation      Knee flexion 110d 100d  Knee extension 0d 0d  Ankle dorsiflexion      Ankle plantarflexion      Ankle inversion      Ankle eversion       (Blank rows = not tested)   LOWER EXTREMITY MMT:   MMT Right eval Left eval B 03/20/22  Hip flexion _0 Hip extension       Hip abduction 4- 4- 4  Hip adduction       Hip internal rotation       Hip external rotation  Knee flexion 4- 4- 4  Knee extension 4- 4- 4  Ankle dorsiflexion _0 Ankle plantarflexion _1 Ankle inversion       Ankle eversion        (Blank rows = not tested)   LOWER EXTREMITY SPECIAL TESTS:  Not applicable    FUNCTIONAL TESTS:  30 seconds chair stand test 3 stand with UE Assist   GAIT: Distance walked: 77f x2 Assistive device utilized: None Level of assistance: Modified independence Comments: antalgic gait excess lateral weight shift       TODAY'S TREATMENT: [facility] Adult PT Treatment:                                                DATE: 03/20/22 Therapeutic Exercise: Nustep L2 8 min Bridge w/ball 10x SLR 10/10 Heel/toe raises 10x Hip fallouts GTB 10x  30s stand test Standing hamstring curls 10/10  [facility] Adult PT Treatment:                                                DATE: 03/15/22 Therapeutic Exercise: Nustep L2 8 min FAQs w/ball 20x 2s hold Seated hamstring curls YTB 20/20 Bridge 2x10 SLR 15/15 Heel slides over slide board 15x B Supine march 15/15 Clams  15/15 SL abduction 15x B Standing marches against wall 10/10 Heel raises 15x against wall Toe raises 15x against wall Seated hamstring stretch B 30s x2  STS w/OH reach  [facility]                                                [facility]: [facility] Therapeutic Exercise: Nustep L1 8 min FAQs w/ball 15x Seated hamstring curls YTB 15/15 Bridge 15x Heel slides over slide board 15x B Supine march 15/15 Clams 15/15 Heel raises 15x Toe raises 15x Seated hamstring stretch B 30s x2       PATIENT EDUCATION:  Education details: Discussed eval findings, rehab rationale and POC and patient is in agreement  Person educated: Patient Education method: Explanation and Handouts Education comprehension: verbalized understanding and needs further education     HOME EXERCISE PROGRAM: Access Code: [email or web address] URL: [email or web address] Date: 03/20/2022 Prepared by: JSharlynn Oliphant Exercises - Small Range Straight Leg Raise  - 1 x daily - 7 x weekly - 2 sets - 10 reps - Hooklying Single Leg Bent Knee Fallouts with Resistance  - 1 x daily - 7 x weekly - 2 sets - 10 reps - Standing Knee Flexion with Counter Support  - 1 x daily - 7 x weekly - 2 sets - 10 reps - Sit to Stand with Arms Crossed  - 2 x daily - 7 x weekly - 1 sets - 5 reps - Heel Toe Raises with Counter Support  - 2 x daily - 7 x weekly - 2 sets - 10 reps - Supine Bridge with Mini Swiss Ball Between Knees  - 2 x daily - 7 x weekly - 2 sets - 10 reps   ASSESSMENT:   CLINICAL IMPRESSION: Rehab goals met, patient feels ready for  independent management.   OBJECTIVE IMPAIRMENTS Abnormal gait, decreased activity tolerance, decreased balance, decreased coordination, decreased endurance, decreased mobility, difficulty walking, decreased strength, postural dysfunction, and pain.    ACTIVITY LIMITATIONS squatting and stairs   PERSONAL FACTORS Age, Fitness, Past/current experiences, and Time since onset of  injury/illness/exacerbation are also affecting patient's functional outcome.    REHAB POTENTIAL: Good   CLINICAL DECISION MAKING: Stable/uncomplicated   EVALUATION COMPLEXITY: Low     GOALS: Goals reviewed with patient? Yes   SHORT TERM GOALS: STGs=LTGs     LONG TERM GOALS: Target date: 03/27/2022    Decrease knee pain to 5/10 Baseline: 7/10; 03/20/22 6/10 worst pain, 4/10 average pain Goal status: Met   2.  4/5 B knee extension strength Baseline: 4-/5 B knee extension strength; 03/20/22 5/4 extension strength B Goal status: Met   3.  Improve 30s chair stand to 4 reps w/UE assist Baseline: 3 stands with UE assist; 03/20/22 6 reps  Goal status: Met   4.  Increase FOTO score to 61 Baseline: 49; 03/20/22 58 Goal status: Met     PLAN: PT FREQUENCY: 1x/week   PT DURATION: 4 weeks   PLANNED INTERVENTIONS: Therapeutic exercises, Therapeutic activity, Neuromuscular re-education, Balance training, Gait training, Patient/Family education, Joint mobilization, Stair training, Aquatic Therapy, Manual therapy, and Re-evaluation   PLAN FOR NEXT SESSION: DC     Lanice Shirts, PT 03/20/2022, 10:51 AM

## 2022-04-05 ENCOUNTER — Ambulatory Visit
Admission: RE | Admit: 2022-04-05 | Discharge: 2022-04-05 | Disposition: A | Discharge status: Home / Self Care | Payer: Medicare HMO | Source: Ambulatory Visit

## 2022-04-05 DIAGNOSIS — F17210 Nicotine dependence, cigarettes, uncomplicated: Secondary | ICD-10-CM

## 2022-04-05 DIAGNOSIS — Z87891 Personal history of nicotine dependence: Secondary | ICD-10-CM

## 2022-04-09 ENCOUNTER — Other Ambulatory Visit: Payer: Self-pay

## 2022-04-09 DIAGNOSIS — Z122 Encounter for screening for malignant neoplasm of respiratory organs: Secondary | ICD-10-CM

## 2022-04-09 DIAGNOSIS — F17210 Nicotine dependence, cigarettes, uncomplicated: Secondary | ICD-10-CM

## 2022-04-09 DIAGNOSIS — Z87891 Personal history of nicotine dependence: Secondary | ICD-10-CM

## 2022-07-15 ENCOUNTER — Other Ambulatory Visit: Payer: Self-pay | Admitting: Family Medicine

## 2022-07-15 DIAGNOSIS — Z1231 Encounter for screening mammogram for malignant neoplasm of breast: Secondary | ICD-10-CM

## 2022-07-22 ENCOUNTER — Ambulatory Visit
Admission: RE | Admit: 2022-07-22 | Discharge: 2022-07-22 | Disposition: A | Discharge status: Home / Self Care | Payer: Medicare HMO | Source: Ambulatory Visit | Attending: Family Medicine | Admitting: Family Medicine

## 2022-07-22 DIAGNOSIS — Z1231 Encounter for screening mammogram for malignant neoplasm of breast: Secondary | ICD-10-CM

## 2022-11-08 DIAGNOSIS — M1711 Unilateral primary osteoarthritis, right knee: Secondary | ICD-10-CM | POA: Insufficient documentation

## 2022-11-08 DIAGNOSIS — M1712 Unilateral primary osteoarthritis, left knee: Secondary | ICD-10-CM | POA: Insufficient documentation

## 2022-12-07 ENCOUNTER — Encounter (HOSPITAL_COMMUNITY): Payer: Self-pay

## 2022-12-07 ENCOUNTER — Emergency Department (HOSPITAL_COMMUNITY): Payer: Medicare HMO

## 2022-12-07 ENCOUNTER — Other Ambulatory Visit: Payer: Self-pay

## 2022-12-07 ENCOUNTER — Emergency Department (HOSPITAL_COMMUNITY)
Admission: EM | Admit: 2022-12-07 | Discharge: 2022-12-07 | Disposition: A | Discharge status: Home / Self Care | Payer: Medicare HMO | Attending: Emergency Medicine | Admitting: Emergency Medicine

## 2022-12-07 DIAGNOSIS — I10 Essential (primary) hypertension: Secondary | ICD-10-CM | POA: Diagnosis not present

## 2022-12-07 DIAGNOSIS — Y92513 Shop (commercial) as the place of occurrence of the external cause: Secondary | ICD-10-CM | POA: Insufficient documentation

## 2022-12-07 DIAGNOSIS — M25561 Pain in right knee: Secondary | ICD-10-CM | POA: Insufficient documentation

## 2022-12-07 DIAGNOSIS — W1830XA Fall on same level, unspecified, initial encounter: Secondary | ICD-10-CM | POA: Diagnosis not present

## 2022-12-07 DIAGNOSIS — M1990 Unspecified osteoarthritis, unspecified site: Secondary | ICD-10-CM | POA: Insufficient documentation

## 2022-12-07 DIAGNOSIS — M25562 Pain in left knee: Secondary | ICD-10-CM | POA: Insufficient documentation

## 2022-12-07 DIAGNOSIS — W19XXXA Unspecified fall, initial encounter: Secondary | ICD-10-CM

## 2022-12-07 DIAGNOSIS — M542 Cervicalgia: Secondary | ICD-10-CM | POA: Diagnosis not present

## 2022-12-07 MED ORDER — ACETAMINOPHEN 500 MG PO TABS: 1000.0000 mg | ORAL_TABLET | Freq: Once | ORAL | Status: DC

## 2022-12-07 NOTE — ED Triage Notes (Signed)
Pt came in via POV d/t falling yesterday while at a hardware store, falling forward & catching herself on her hands & knees & then after landing toppling to the Lt & smacking her head on the Lt side. A/Ox4, rates her pain 8/10 while in triage. C/o Lt shoulder/side/hip/leg pain. Bil knee swelling noted while in triage. Using a cain & reports she has only used it to help her walk since the fall.

## 2022-12-07 NOTE — Discharge Instructions (Addendum)
Thank you for coming to [facility] Emergency Department. You were seen for fall with pain. We did an exam, and imaging, and these showed no acute findings. You will likely be sore from your fall. You can alternate taking Tylenol and ibuprofen as needed for pain. You can take  tylenol (acetaminophen) every 4-6 hours, and 600 mg ibuprofen 3 times a day. Use rest, ice, and elevation to help decrease the swelling/inflammation/pain in your knees and shoulder.  Please follow up with your primary care provider on Wednesday as originally scheduled.   Do not hesitate to return to the ED or call 911 if you experience: -Worsening symptoms -Numbness/tingling -Lightheadedness, passing out -Fevers/chills -Anything else that concerns you

## 2022-12-07 NOTE — ED Provider Notes (Signed)
[place] EMERGENCY DEPARTMENT AT [facility] Provider Note   CSN: [id number] Arrival date & time: 12/07/22  1526     History  Chief Complaint  Patient presents with   Fall   Shoulder Pain   Knee Pain   Leg Pain    Abigail Guerrero is a 77 y.o. female with HTN, HLD, essential tremor, osteopenia, obesity, gout, eczema, arthritis who presents with fall.   Pt came in via POV accompanied by her daughter d/t falling yesterday while at a hardware store, falling forward & catching herself on her hands & knees. She denies any head trauma or headache.Rates her pain 8/10 while in triage. C/o Lt shoulder/side/hip/leg pain. Using a cane & reports she has only used it to help her walk since the fall d/t bilateral knee pain. No head trauma, no headache/LOC/neck pain. Otherwise in her NSOH. Denies any numbness/tingling, back pain, chest or abdominal pain.      Fall  Shoulder Pain Knee Pain Leg Pain      Home Medications Prior to Admission medications   Not on File      Allergies    Shellfish allergy    Review of Systems   Review of Systems Review of systems Negative for f/c, head trauma, LOC.  A 10 point review of systems was performed and is negative unless otherwise reported in HPI.  Physical Exam Updated Vital Signs BP 123/61   Pulse (!) 57   Temp 98.6 F (37 C) (Oral)   Resp 18   SpO2 100%  Physical Exam General: Normal appearing female, lying in bed.  HEENT: PERRLA, EOMI, Sclera anicteric, MMM, trachea midline. Clear oropharynx. Tongue protrudes midline. NCAT. Stable forehead/midface/nasal bridge. TTP to lateral left neck and trapezius muscle. Cardiology: RRR, no murmurs/rubs/gallops. BL radial and DP pulses equal bilaterally.  Resp: Normal respiratory rate and effort. CTAB, no wheezes, rhonchi, crackles.  Abd: Soft, non-tender, non-distended. No rebound tenderness or guarding.  GU: Deferred. MSK: TTP to anterior bilateral knees with mild  edema. No deformities. Intact flexion/extension though somewhat limited d/t pain. Neg anterior/posterior drawer bilaterally. Soft compartments. NVI bilaterally.  Skin: warm, dry. No rashes or lesions. Back: No midline C, T, or L spine tenderness to palpation/stepoffs/deformities Neuro: A&Ox4, CNs II-XII grossly intact. MAEs. Sensation grossly intact.  Psych: Normal mood and affect.   ED Results / Procedures / Treatments   Labs (all labs ordered are listed, but only abnormal results are displayed) Labs Reviewed - No data to display  EKG None  Radiology See ED course  Procedures Procedures    Medications Ordered in ED Medications - No data to display  ED Course/ Medical Decision Making/ A&P                          Medical Decision Making Amount and/or Complexity of Data Reviewed Radiology: ordered. Decision-making details documented in ED Course.    This patient presents to the ED for concern of mechanical fall, BL knee pain, L sided neck/shoulder pain; this involves an extensive number of treatment options, and is a complaint that carries with it a high risk of complications and morbidity.  Patient is very well-appearing, HDS.  MDM:    No head trauma/LOC, no midline C-spine TTP, no need for head/neck imaging. Patient with TTP to L sided paraspinal neck with no masses/expanding hematoma. She has no deformities to extremities, compartments are soft, overall very low concern for compartment syndrome or life/limb-threatening pathology.  She is intact pulses and range of motion.  Will evaluate for occult fracture or dislocation.  No open wounds or abrasions, no need to update Tdap.   Clinical Course as of 12/12/22 1528  Sat Dec 07, 2022  1800 DG Knee Right Port 1. No acute fracture or dislocation. 2. Severe arthritic changes.   [HN]  1801 DG Knee Left Port 1. No acute fracture or dislocation. 2. Severe arthritic changes of the knee.   [HN]  1801 DG Shoulder Left 1. No  acute fracture or dislocation. 2. Mild degenerative changes.   [HN]    Clinical Course User Index [HN] Loetta Rough, MD    Imaging Studies ordered: I ordered imaging studies including XRs I independently visualized and interpreted imaging. I agree with the radiologist interpretation  Additional history obtained from chart review, daughter at bedside.    Reevaluation: After the interventions noted above, I reevaluated the patient and found that they have :improved  Social Determinants of Health: Patient lives independently   Disposition:  Patient and her daughter are relieved that the XRs are negative, just wanted to make sure nothing was broken. She is anxious to be discharged from ED. She has no emergent pathology and is stable for DC. I encouraged PCP or orthopedic f/u if her symptoms do not improve within 1-2 weeks. DC w/ discharge instructions/return precautions. All questions answered to patient's satisfaction.    Co morbidities that complicate the patient evaluation History reviewed. No pertinent past medical history.   Medicines Meds ordered this encounter  Medications   DISCONTD: acetaminophen (TYLENOL) tablet 1,000 mg    I have reviewed the patients home medicines and have made adjustments as needed  Problem List / ED Course: Problem List Items Addressed This Visit   None Visit Diagnoses     Fall, initial encounter    -  Primary   Acute pain of both knees                       This note was created using dictation software, which may contain spelling or grammatical errors.    Loetta Rough, MD 12/19/22 [phone]

## 2022-12-11 DIAGNOSIS — Z9189 Other specified personal risk factors, not elsewhere classified: Secondary | ICD-10-CM | POA: Insufficient documentation

## 2023-04-09 ENCOUNTER — Ambulatory Visit
Admission: RE | Admit: 2023-04-09 | Discharge: 2023-04-09 | Discharge status: Home / Self Care | Payer: Medicare PPO | Source: Ambulatory Visit

## 2023-04-09 DIAGNOSIS — Z122 Encounter for screening for malignant neoplasm of respiratory organs: Secondary | ICD-10-CM

## 2023-04-09 DIAGNOSIS — Z87891 Personal history of nicotine dependence: Secondary | ICD-10-CM

## 2023-04-09 DIAGNOSIS — F17210 Nicotine dependence, cigarettes, uncomplicated: Secondary | ICD-10-CM

## 2023-04-16 ENCOUNTER — Other Ambulatory Visit: Payer: Self-pay | Admitting: Acute Care

## 2023-04-16 DIAGNOSIS — Z87891 Personal history of nicotine dependence: Secondary | ICD-10-CM

## 2023-04-16 DIAGNOSIS — Z122 Encounter for screening for malignant neoplasm of respiratory organs: Secondary | ICD-10-CM

## 2023-04-16 DIAGNOSIS — F17210 Nicotine dependence, cigarettes, uncomplicated: Secondary | ICD-10-CM

## 2023-07-01 ENCOUNTER — Other Ambulatory Visit: Payer: Self-pay | Admitting: Family Medicine

## 2023-07-01 DIAGNOSIS — Z0000 Encounter for general adult medical examination without abnormal findings: Secondary | ICD-10-CM

## 2023-07-01 LAB — COLOGUARD: COLOGUARD: NEGATIVE

## 2023-07-01 LAB — EXTERNAL GENERIC LAB PROCEDURE: COLOGUARD: NEGATIVE

## 2023-08-06 ENCOUNTER — Ambulatory Visit
Admission: RE | Admit: 2023-08-06 | Discharge: 2023-08-06 | Disposition: A | Discharge status: Home / Self Care | Payer: Medicare PPO | Source: Ambulatory Visit | Attending: Family Medicine | Admitting: Family Medicine

## 2023-08-06 DIAGNOSIS — Z0000 Encounter for general adult medical examination without abnormal findings: Secondary | ICD-10-CM

## 2023-09-09 IMAGING — MG MM DIGITAL SCREENING BILAT W/ TOMO AND CAD
6 of 10 series · 6 of 30 positions shown · non-contrast
Comparison: Previous exam(s).

CLINICAL DATA: Screening.

EXAM:
DIGITAL SCREENING BILATERAL MAMMOGRAM WITH TOMOSYNTHESIS AND CAD
TECHNIQUE: Bilateral screening digital craniocaudal and mediolateral oblique
mammograms were obtained. Bilateral screening digital breast
tomosynthesis was performed. The images were evaluated with
computer-aided detection.

[L CC synth-2D]
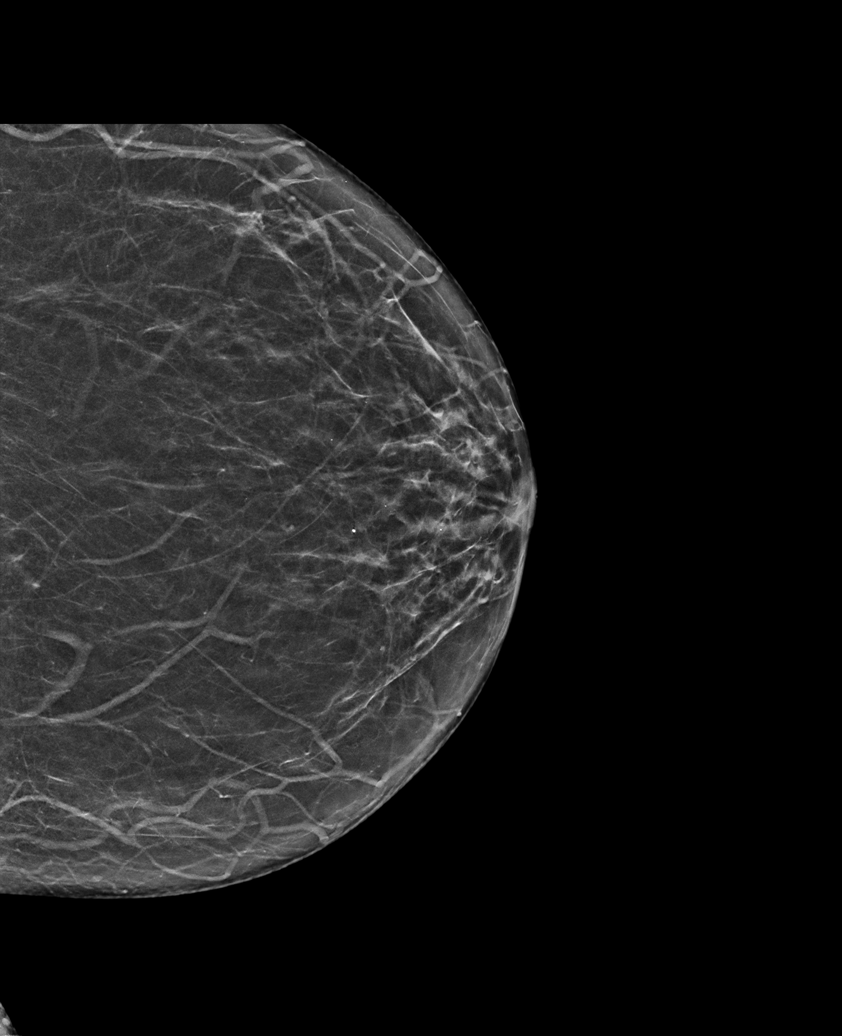

[R CC synth-2D]
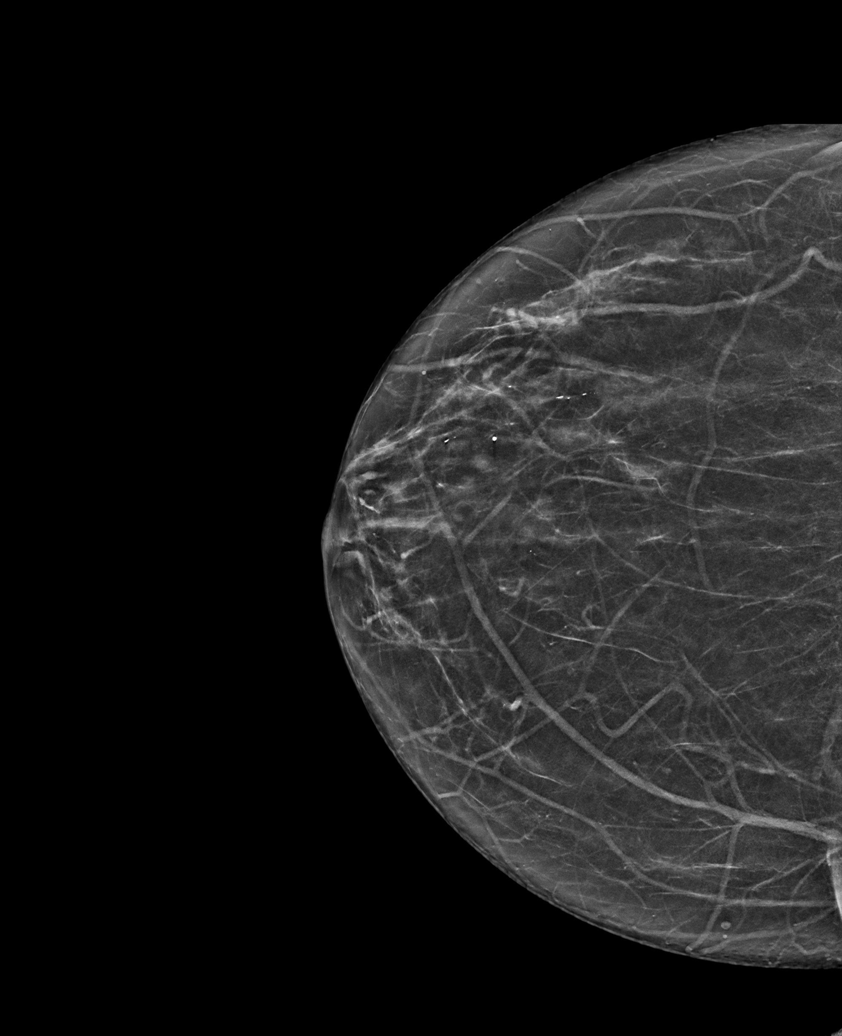

[R XCCL synth-2D]
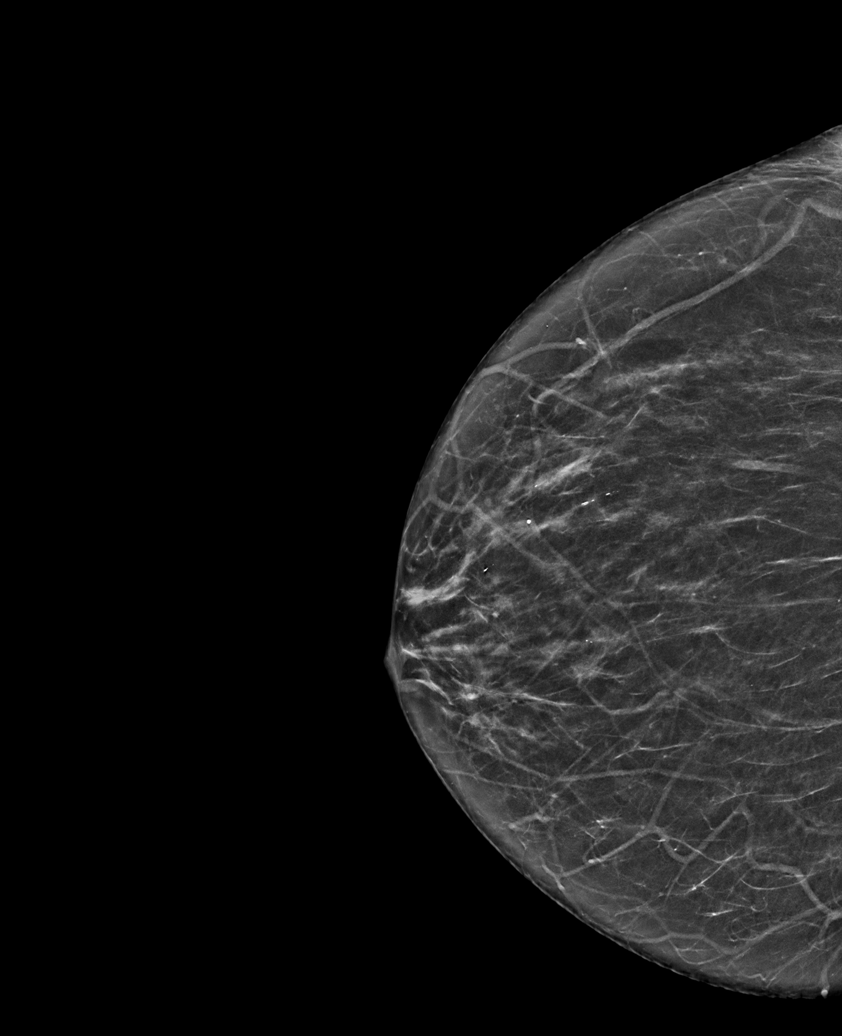

[R MLO synth-2D]
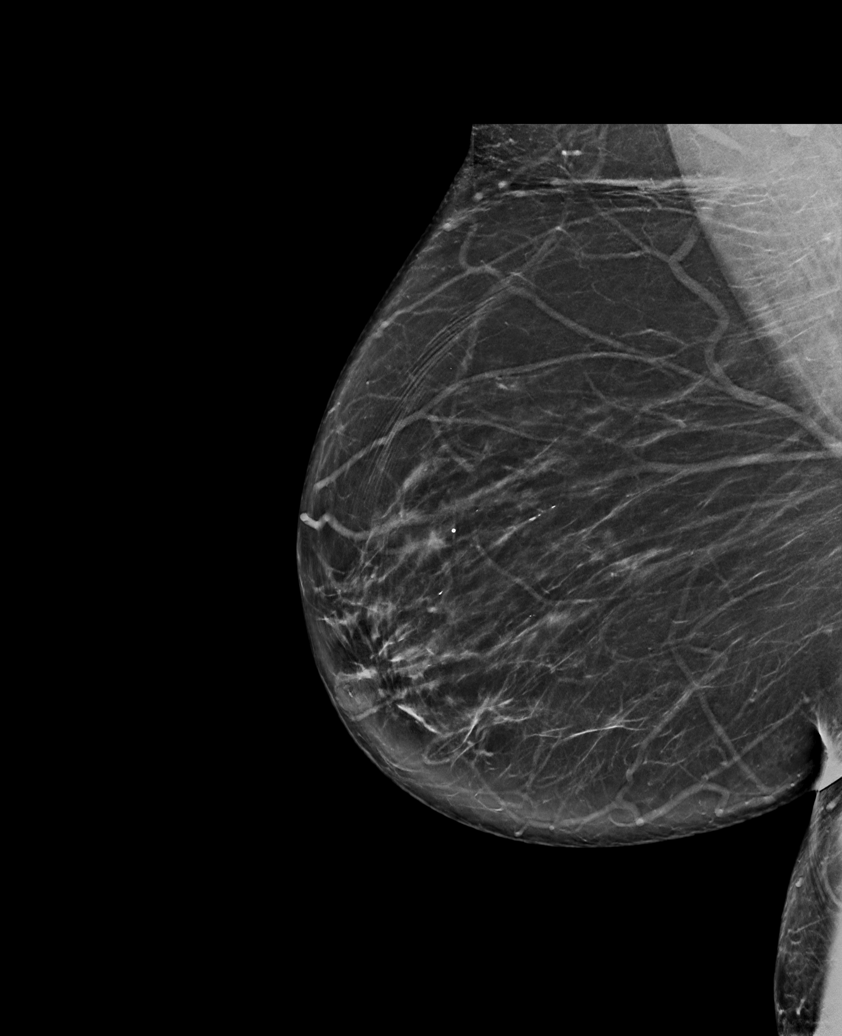

[L MLO synth-2D]
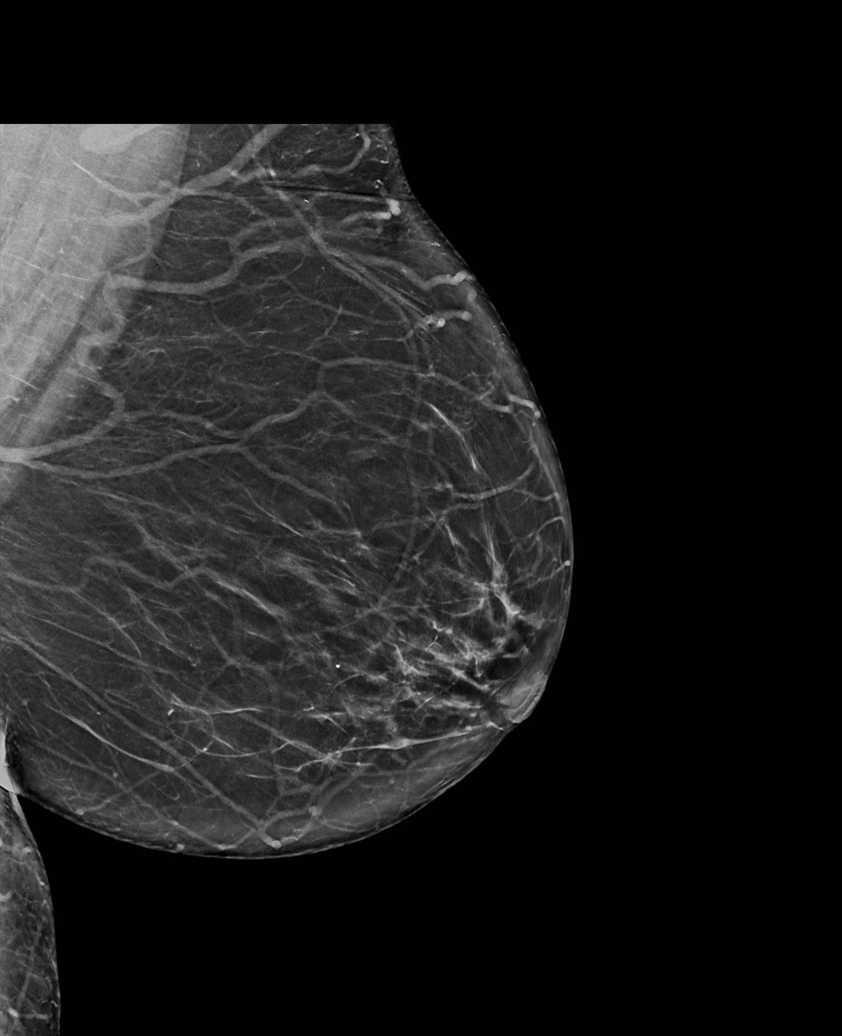

[R XCCL tomo · tomo slice 34/67.0]
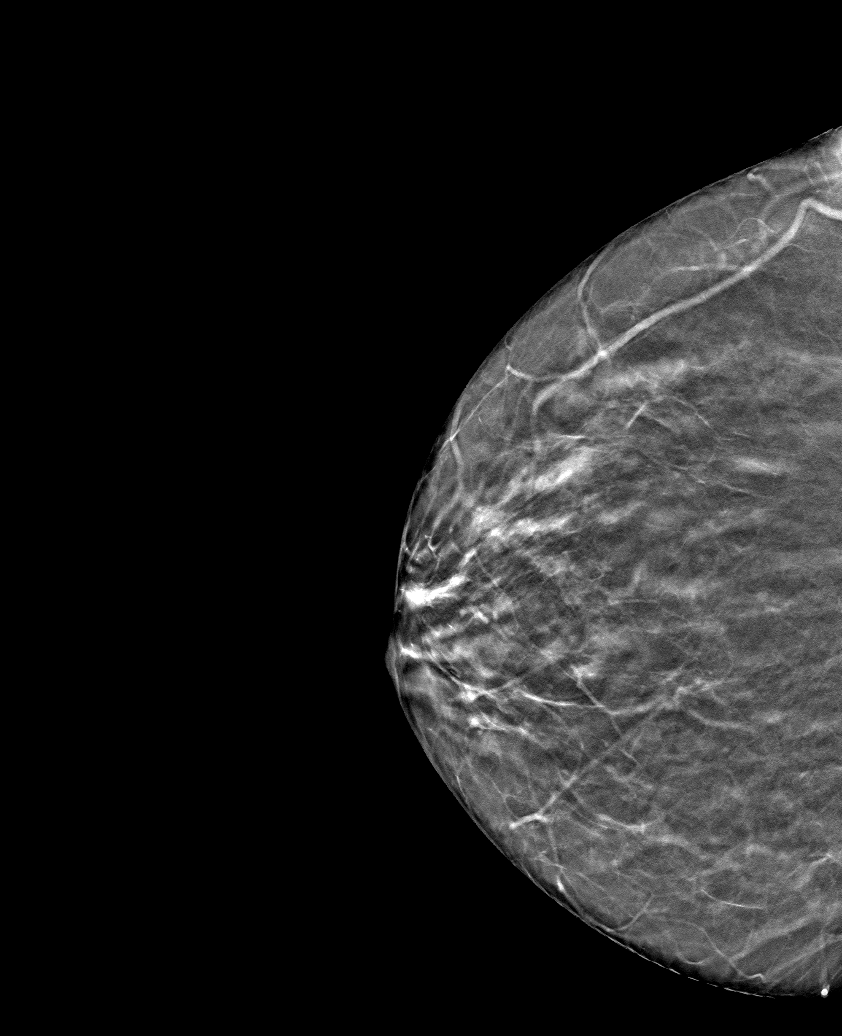

[6 of 30 positions shown; findings below may reference images not displayed]

ACR Breast Density Category b: There are scattered areas of
fibroglandular density.
FINDINGS: There are no findings suspicious for malignancy.
IMPRESSION: No mammographic evidence of malignancy. A result letter of this
screening mammogram will be mailed directly to the patient.

RECOMMENDATION:
Screening mammogram in one year. (Code:51-O-LD2)

BI-RADS CATEGORY  1: Negative.

## 2023-11-06 ENCOUNTER — Ambulatory Visit: Payer: Medicare PPO | Admitting: Orthopaedic Surgery

## 2023-11-06 ENCOUNTER — Telehealth: Payer: Self-pay

## 2023-11-06 VITALS — Ht 62.0 in | Wt 189.0 lb

## 2023-11-06 DIAGNOSIS — M25562 Pain in left knee: Secondary | ICD-10-CM | POA: Diagnosis not present

## 2023-11-06 DIAGNOSIS — M25561 Pain in right knee: Secondary | ICD-10-CM | POA: Diagnosis not present

## 2023-11-06 DIAGNOSIS — M1712 Unilateral primary osteoarthritis, left knee: Secondary | ICD-10-CM | POA: Diagnosis not present

## 2023-11-06 DIAGNOSIS — M1711 Unilateral primary osteoarthritis, right knee: Secondary | ICD-10-CM | POA: Diagnosis not present

## 2023-11-06 DIAGNOSIS — G8929 Other chronic pain: Secondary | ICD-10-CM

## 2023-11-06 MED ORDER — METHYLPREDNISOLONE ACETATE 40 MG/ML IJ SUSP
40.0000 mg | INTRAMUSCULAR | Status: AC | PRN
  Administered 2023-11-06: 40 mg via INTRA_ARTICULAR

## 2023-11-06 MED ORDER — LIDOCAINE HCL 1 % IJ SOLN
3.0000 mL | INTRAMUSCULAR | Status: AC | PRN
  Administered 2023-11-06: 3 mL

## 2023-11-06 NOTE — Progress Notes (Signed)
 Abigail Guerrero is a very pleasant 78 year old female with bilateral knee pain with the left worse than right.  This been going on for many years now.  She does ambulate using a cane.  She has had steroid injection by another orthopedic colleague with the last being in her knees about 4 months ago.  She says these are not working anymore.  She actually is only coming to see me because she had seen someone else who had done knee replacement surgery on and she just wanted to see what my thoughts were.  She does report daily knee pain that is definitely affecting her mobility, her quality of life and her actives daily living.  She is not a diabetic and not on blood thinning medications.  I was able to review her past medical history and medications with her and reviewed these in epic.  Both knees were assessed and have slight swelling in both knees.  Both knees have significant medial and lateral joint line tenderness with patellofemoral crepitation.  She has good range of motion of both knees and there ligamentously stable.  X-rays from April of last year of both knees were reviewed from the [facility] system.  Her left knee has significant bone-on-bone arthritis of the medial compartment of her knee slight varus malalignment and patellofemoral arthritic changes.  The right knee has tricompartment arthritis involving both medial lateral joint lines more so on the lateral joint line with osteophytes in all 3 compartments.  Both knees are definitely tricompartment arthritis.  We talked in length in detail about knee replacement and options for her.  She has never had hyaluronic acid so she is not ready just yet for surgery so she is like to consider steroid injection in both knees today and then consider hyaluronic acid for her knees which I think is reasonable given the failure of steroid injections in the past.  We will work on getting those hopefully approved in order for her for both knees to treat the pain from  osteoarthritis.  This patient is diagnosed with osteoarthritis of the knee(s).    Radiographs show evidence of joint space narrowing, osteophytes, subchondral sclerosis and/or subchondral cysts.  This patient has knee pain which interferes with functional and activities of daily living.    This patient has experienced inadequate response, adverse effects and/or intolerance with conservative treatments such as acetaminophen, NSAIDS, topical creams, physical therapy or regular exercise, knee bracing and/or weight loss.   This patient has experienced inadequate response or has a contraindication to intra articular steroid injections for at least 3 months.   This patient is not scheduled to have a total knee replacement within 6 months of starting treatment with viscosupplementation.    Procedure Note  Patient: Abigail Guerrero             Date of Birth: 1946/03/10           MRN: [id number]             Visit Date: 11/06/2023  Procedures: Visit Diagnoses:  1. Chronic pain of left knee   2. Chronic pain of right knee   3. Unilateral primary osteoarthritis, left knee   4. Unilateral primary osteoarthritis, right knee     Large Joint Inj: R knee on 11/06/2023 2:28 PM Indications: diagnostic evaluation and pain Details: 22 G 1.5 in needle, superolateral approach  Arthrogram: No  Medications: 3 mL lidocaine 1 %; 40 mg methylPREDNISolone acetate 40 MG/ML Outcome: tolerated well, no immediate complications Procedure, treatment alternatives,  risks and benefits explained, specific risks discussed. Consent was given by the patient. Immediately prior to procedure a time out was called to verify the correct patient, procedure, equipment, support staff and site/side marked as required. Patient was prepped and draped in the usual sterile fashion.    Large Joint Inj: L knee on 11/06/2023 2:28 PM Indications: diagnostic evaluation and pain Details: 22 G 1.5 in needle, superolateral  approach  Arthrogram: No  Medications: 3 mL lidocaine 1 %; 40 mg methylPREDNISolone acetate 40 MG/ML Outcome: tolerated well, no immediate complications Procedure, treatment alternatives, risks and benefits explained, specific risks discussed. Consent was given by the patient. Immediately prior to procedure a time out was called to verify the correct patient, procedure, equipment, support staff and site/side marked as required. Patient was prepped and draped in the usual sterile fashion.

## 2023-11-06 NOTE — Telephone Encounter (Signed)
 Bilateral knee gel injections

## 2023-11-11 ENCOUNTER — Encounter: Payer: Self-pay | Admitting: Obstetrics and Gynecology

## 2023-11-11 ENCOUNTER — Ambulatory Visit: Payer: Medicare PPO | Admitting: Obstetrics and Gynecology

## 2023-11-11 VITALS — BP 146/82 | HR 61 | Ht 62.0 in | Wt 188.3 lb

## 2023-11-11 DIAGNOSIS — Z01419 Encounter for gynecological examination (general) (routine) without abnormal findings: Secondary | ICD-10-CM | POA: Diagnosis not present

## 2023-11-11 DIAGNOSIS — Z1231 Encounter for screening mammogram for malignant neoplasm of breast: Secondary | ICD-10-CM

## 2023-11-11 DIAGNOSIS — R252 Cramp and spasm: Secondary | ICD-10-CM | POA: Insufficient documentation

## 2023-11-11 DIAGNOSIS — M170 Bilateral primary osteoarthritis of knee: Secondary | ICD-10-CM | POA: Insufficient documentation

## 2023-11-11 DIAGNOSIS — M85851 Other specified disorders of bone density and structure, right thigh: Secondary | ICD-10-CM | POA: Diagnosis not present

## 2023-11-11 NOTE — Progress Notes (Signed)
 "  ANNUAL EXAM Patient name: Abigail Guerrero MRN [id number]  Date of birth: 1946/06/05 Chief Complaint:   Gynecologic Exam  History of Present Illness:   Abigail Guerrero is a 78 y.o. [id number] being seen today for a routine annual exam.  Current complaints: none  Discussed the use of AI scribe software for clinical note transcription with the patient, who gave verbal consent to proceed.  History of Present Illness Abigail Guerrero is a 78 year old female who presents for establishment of care with a new gynecologist.  She has no current gynecological issues or complaints. No abnormal vaginal discharge or bleeding since her last menstrual period, which occurred around the age of 53 to 53. No history of abnormal Pap smears or prior gynecological surgeries such as dilation and curettage or cyst surgeries. She maintains regular follow-ups with her primary care provider and has previously undergone a bone scan. She is compliant with vitamin D and calcium supplementation.  She is not currently sexually active. No breast or nipple changes. She has never had an abnormal mammogram, with the most recent one being in December.  She occasionally experiences a 'pulling' pain in the lower abdomen or pelvis, but it is infrequent and not bothersome enough to require treatment.  No family history of breast, uterine, or colon cancer.   No LMP recorded. Patient is postmenopausal.   Last pap No results found for: DIAGPAP, HPVHIGH, ADEQPAP   H/O abnormal pap: no Last mammogram: 07/2023 BIRADS 1.  Last colonoscopy: 06/2023 Cologard Negative.      11/11/2023   10:03 AM  Depression screen PHQ 2/9  Decreased Interest 1  Down, Depressed, Hopeless 0  PHQ - 2 Score 1  Altered sleeping 0  Tired, decreased energy 0  Change in appetite 0  Feeling bad or failure about yourself  0  Trouble concentrating 0  Moving slowly or fidgety/restless 0   Suicidal thoughts 0  PHQ-9 Score 1        11/11/2023   10:03 AM  GAD 7 : Generalized Anxiety Score  Nervous, Anxious, on Edge 1  Control/stop worrying 0  Worry too much - different things 0  Trouble relaxing 0  Restless 0  Easily annoyed or irritable 0  Afraid - awful might happen 0  Total GAD 7 Score 1     Review of Systems:   Pertinent items are noted in HPI Denies any headaches, blurred vision, fatigue, shortness of breath, chest pain, abdominal pain, abnormal vaginal discharge/itching/odor/irritation, problems with periods, bowel movements, urination, or intercourse unless otherwise stated above. Pertinent History Reviewed:  Reviewed past medical,surgical, social and family history.  Reviewed problem list, medications and allergies. Physical Assessment:   Vitals:   11/11/23 1001 11/11/23 1027  BP: (!) 152/86 (!) 146/82  Pulse: 61   Weight: 188 lb 4.8 oz (85.4 kg)   Height: 5' 2 (1.575 m)   Body mass index is 34.44 kg/m.        Physical Examination:   General appearance - well appearing, and in no distress  Mental status - alert, oriented to person, place, and time  Psych:  She has a normal mood and affect  Skin - warm and dry, normal color, no suspicious lesions noted  Chest - effort normal, all lung fields clear to auscultation bilaterally  Heart - normal rate and regular rhythm  Breasts - breasts appear normal, no suspicious masses, no skin or nipple changes or  axillary nodes  Abdomen - soft, nontender,  nondistended, no masses or organomegaly  Pelvic - deferred  Extremities:  No swelling or varicosities noted  Chaperone present for exam  No results found for this or any previous visit (from the past 24 hours).    Assessment & Plan:   Assessment & Plan Routine gynecological care Routine screenings are the primary focus. No gynecological complaints or significant family history of cancer. - Order mammogram as per routine screening. - Schedule bone  density scan (DEXA) as it has been two years since the last scan.  Intermittent lower abdominal/pelvic pain Reports occasional pulling sensation. No medication taken. No significant bother. - Monitor symptoms. No intervention unless symptoms worsen.         Orders Placed This Encounter  Procedures   MM 3D SCREENING MAMMOGRAM BILATERAL BREAST    Meds: No orders of the defined types were placed in this encounter.   Follow-up: No follow-ups on file.  Carter Quarry, MD 11/11/2023 10:23 AM "

## 2023-11-20 ENCOUNTER — Other Ambulatory Visit: Payer: Self-pay

## 2023-11-20 DIAGNOSIS — M1712 Unilateral primary osteoarthritis, left knee: Secondary | ICD-10-CM

## 2023-11-20 DIAGNOSIS — M1711 Unilateral primary osteoarthritis, right knee: Secondary | ICD-10-CM

## 2023-11-20 NOTE — Telephone Encounter (Signed)
Talked with patient and appt.has been scheduled for gel injection.  

## 2023-12-24 ENCOUNTER — Encounter: Payer: Self-pay | Admitting: Orthopaedic Surgery

## 2023-12-24 ENCOUNTER — Ambulatory Visit (INDEPENDENT_AMBULATORY_CARE_PROVIDER_SITE_OTHER): Admitting: Orthopaedic Surgery

## 2023-12-24 DIAGNOSIS — M170 Bilateral primary osteoarthritis of knee: Secondary | ICD-10-CM | POA: Diagnosis not present

## 2023-12-24 DIAGNOSIS — M1712 Unilateral primary osteoarthritis, left knee: Secondary | ICD-10-CM

## 2023-12-24 DIAGNOSIS — M1711 Unilateral primary osteoarthritis, right knee: Secondary | ICD-10-CM

## 2023-12-24 MED ORDER — HYALURONAN 88 MG/4ML IX SOSY
88.0000 mg | PREFILLED_SYRINGE | INTRA_ARTICULAR | Status: AC | PRN
  Administered 2023-12-24: 88 mg via INTRA_ARTICULAR

## 2023-12-24 MED ORDER — LIDOCAINE HCL 1 % IJ SOLN
3.0000 mL | INTRAMUSCULAR | Status: AC | PRN
  Administered 2023-12-24: 3 mL

## 2023-12-24 MED ORDER — LIDOCAINE HCL 1 % IJ SOLN
3.0000 mL | INTRAMUSCULAR | Status: AC | PRN
Start: 2023-12-24 — End: 2023-12-24
  Administered 2023-12-24: 3 mL

## 2023-12-24 NOTE — Progress Notes (Signed)
 The patient comes today for scheduled hyaluronic acid injections in her knees to treat the pain from osteoarthritis.  Steroid injections have no longer helped her at all.  She does ambulate with a cane and has severe pain in both knees with the left worse than the right.  Both knees have significant deformity and she walks.  I did place Monovisc in both knees which she tolerated well.  I will see her back in 2 months.  At that visit she needs a weight and BMI calculation and we will likely discuss knee replacement surgery if gel injections do not help.  This is the first time she has had hyaluronic acid injection.  She agrees with the treatment plan.  She is miserable and this is detrimentally affecting her mobility and her quality of life.  Lot #[id number]    Procedure Note  Patient: Abigail Guerrero             Date of Birth: 07/15/1946           MRN: [id number]             Visit Date: 12/24/2023  Procedures: Visit Diagnoses:  1. Unilateral primary osteoarthritis, left knee   2. Unilateral primary osteoarthritis, right knee     Large Joint Inj: R knee on 12/24/2023 11:03 AM Indications: diagnostic evaluation and pain Details: 22 G 1.5 in needle, superolateral approach  Arthrogram: No  Medications: 3 mL lidocaine  1 %; 88 mg Hyaluronan 88 MG/4ML Outcome: tolerated well, no immediate complications Procedure, treatment alternatives, risks and benefits explained, specific risks discussed. Consent was given by the patient. Immediately prior to procedure a time out was called to verify the correct patient, procedure, equipment, support staff and site/side marked as required. Patient was prepped and draped in the usual sterile fashion.    Large Joint Inj: L knee on 12/24/2023 11:03 AM Indications: diagnostic evaluation and pain Details: 22 G 1.5 in needle, superolateral approach  Arthrogram: No  Medications: 3 mL lidocaine  1 %; 88 mg Hyaluronan 88 MG/4ML Outcome: tolerated  well, no immediate complications Procedure, treatment alternatives, risks and benefits explained, specific risks discussed. Consent was given by the patient. Immediately prior to procedure a time out was called to verify the correct patient, procedure, equipment, support staff and site/side marked as required. Patient was prepped and draped in the usual sterile fashion.

## 2024-02-25 ENCOUNTER — Ambulatory Visit: Admitting: Orthopaedic Surgery

## 2024-03-02 ENCOUNTER — Telehealth: Payer: Self-pay | Admitting: Orthopaedic Surgery

## 2024-03-02 NOTE — Telephone Encounter (Signed)
 Patient request something for severe knee pain

## 2024-03-03 NOTE — Telephone Encounter (Signed)
 Patient aware of the below message

## 2024-04-09 ENCOUNTER — Ambulatory Visit
Admission: RE | Admit: 2024-04-09 | Discharge: 2024-04-09 | Disposition: A | Discharge status: Home / Self Care | Source: Ambulatory Visit | Attending: Acute Care | Admitting: Acute Care

## 2024-04-09 DIAGNOSIS — Z87891 Personal history of nicotine dependence: Secondary | ICD-10-CM

## 2024-04-09 DIAGNOSIS — Z122 Encounter for screening for malignant neoplasm of respiratory organs: Secondary | ICD-10-CM

## 2024-04-09 DIAGNOSIS — F17210 Nicotine dependence, cigarettes, uncomplicated: Secondary | ICD-10-CM

## 2024-04-26 ENCOUNTER — Other Ambulatory Visit: Payer: Self-pay | Admitting: Acute Care

## 2024-04-26 DIAGNOSIS — F17210 Nicotine dependence, cigarettes, uncomplicated: Secondary | ICD-10-CM

## 2024-04-26 DIAGNOSIS — Z122 Encounter for screening for malignant neoplasm of respiratory organs: Secondary | ICD-10-CM

## 2024-04-26 DIAGNOSIS — Z87891 Personal history of nicotine dependence: Secondary | ICD-10-CM

## 2024-06-21 ENCOUNTER — Encounter: Payer: Self-pay | Admitting: Radiology

## 2024-08-06 ENCOUNTER — Inpatient Hospital Stay: Admission: RE | Admit: 2024-08-06 | Discharge: 2024-08-06 | Attending: Obstetrics and Gynecology

## 2024-08-06 ENCOUNTER — Other Ambulatory Visit

## 2024-08-06 DIAGNOSIS — Z1231 Encounter for screening mammogram for malignant neoplasm of breast: Secondary | ICD-10-CM

## 2024-08-24 ENCOUNTER — Encounter: Payer: Self-pay | Admitting: Obstetrics and Gynecology

## 2024-08-24 ENCOUNTER — Encounter: Payer: Self-pay | Admitting: Family Medicine

## 2024-08-24 ENCOUNTER — Telehealth: Payer: Self-pay | Admitting: Family Medicine

## 2024-08-24 DIAGNOSIS — N644 Mastodynia: Secondary | ICD-10-CM

## 2024-08-24 DIAGNOSIS — Z1231 Encounter for screening mammogram for malignant neoplasm of breast: Secondary | ICD-10-CM

## 2024-08-24 NOTE — Telephone Encounter (Signed)
 Patient's daughter is calling to schedule a mammogram. I let her know that I will send a message to our clinical staff so that we can put in a referral for the patient. Daughter would like a call back with the office information that her mom will be referred to.

## 2024-08-24 NOTE — Telephone Encounter (Addendum)
 Called patient to discuss message from front office -  Good afternoon! I have this patient's daughter on the phone requesting to speak to our clinical staff because her mother needs an urgent appointment with the breast center. She says that she spoke with them and that they informed her that she needs a referral for a diagnostic mammogram from Dr. Jeralyn. She claims that we never sent that referral.  I left a message we are calling to follow up on information we received from her family member and to call us  back.  Per chart review she saw Dr. Jeralyn 11/11/23 and recommended mamogram per routine but was not ordered. She had her last mammogram 08/06/23 and is now due. Orders placed per protocol an per MD plan of care  and called to schedule with [facility] for 08/28/23 at 1:00.  I called patient to notify and she reports she doesn't need that; she needs a referral for a diagnostic because she had a mamogram appointment and told them she had tenderness in her right breast and they did not do a mammogram and told her they need her doctor to order a diagnostic.  She reports she started having tenderness right lower breast about a month ago and it has mostly gotten better . She states she is not sure if she has a lump. I explained I will send to her doctor and we will get it ordered and scheduled and reach back out to her asap. She voices understanding. [facility] and scheduled per protocol . They cancelled routine mammogram and scheduled Diagnostic Mammogram and US  per protocol for 09/01/23 at 8:30 arrive at 8:10. I called [facility] and informed her of the appointments. She voices understanding and appreciation. Rock Skip PEAK

## 2024-08-24 NOTE — Telephone Encounter (Signed)
 See other telephone note, I called and spoke with patient and scheduled diagnostic mammogram Abigail Guerrero

## 2024-08-27 ENCOUNTER — Ambulatory Visit

## 2024-08-27 ENCOUNTER — Telehealth: Payer: Self-pay

## 2024-08-27 NOTE — Telephone Encounter (Signed)
 Erroneous encounter.  Previous voicemail was already documented.

## 2024-08-31 ENCOUNTER — Other Ambulatory Visit

## 2024-08-31 ENCOUNTER — Ambulatory Visit
Admission: RE | Admit: 2024-08-31 | Discharge: 2024-08-31 | Disposition: A | Source: Ambulatory Visit | Attending: Obstetrics and Gynecology

## 2024-08-31 DIAGNOSIS — Z1231 Encounter for screening mammogram for malignant neoplasm of breast: Secondary | ICD-10-CM

## 2024-08-31 DIAGNOSIS — N644 Mastodynia: Secondary | ICD-10-CM

## 2024-09-01 ENCOUNTER — Ambulatory Visit: Payer: Self-pay | Admitting: Obstetrics and Gynecology

## 2024-12-29 ENCOUNTER — Other Ambulatory Visit (HOSPITAL_BASED_OUTPATIENT_CLINIC_OR_DEPARTMENT_OTHER)
